# Patient Record
Sex: Male | Born: 1945 | Race: Black or African American | Hispanic: No | Marital: Married | Smoking: Former smoker
Health system: Southern US, Community
[De-identification: ages and names within clinical notes are randomized; demographics above are authoritative.]

## PROBLEM LIST (undated history)

## (undated) DIAGNOSIS — J449 Chronic obstructive pulmonary disease, unspecified: Secondary | ICD-10-CM

## (undated) DIAGNOSIS — I10 Essential (primary) hypertension: Secondary | ICD-10-CM

## (undated) DIAGNOSIS — M109 Gout, unspecified: Secondary | ICD-10-CM

## (undated) DIAGNOSIS — N189 Chronic kidney disease, unspecified: Secondary | ICD-10-CM

## (undated) DIAGNOSIS — Z992 Dependence on renal dialysis: Secondary | ICD-10-CM

## (undated) DIAGNOSIS — N186 End stage renal disease: Secondary | ICD-10-CM

## (undated) DIAGNOSIS — I639 Cerebral infarction, unspecified: Secondary | ICD-10-CM

## (undated) DIAGNOSIS — D631 Anemia in chronic kidney disease: Secondary | ICD-10-CM

## (undated) DIAGNOSIS — G40909 Epilepsy, unspecified, not intractable, without status epilepticus: Secondary | ICD-10-CM

## (undated) HISTORY — PX: OTHER SURGICAL HISTORY: SHX169

---

## 2014-01-17 ENCOUNTER — Emergency Department (HOSPITAL_COMMUNITY): Payer: Medicaid - Out of State

## 2014-01-17 ENCOUNTER — Inpatient Hospital Stay (HOSPITAL_COMMUNITY)
Admission: EM | Admit: 2014-01-17 | Discharge: 2014-01-19 | DRG: 189 | Disposition: A | Discharge status: Home / Self Care | Payer: Medicaid - Out of State | Attending: Internal Medicine | Admitting: Internal Medicine

## 2014-01-17 ENCOUNTER — Other Ambulatory Visit: Payer: Self-pay

## 2014-01-17 ENCOUNTER — Encounter (HOSPITAL_COMMUNITY): Payer: Self-pay | Admitting: Emergency Medicine

## 2014-01-17 DIAGNOSIS — J811 Chronic pulmonary edema: Principal | ICD-10-CM | POA: Diagnosis present

## 2014-01-17 DIAGNOSIS — J4489 Other specified chronic obstructive pulmonary disease: Secondary | ICD-10-CM | POA: Diagnosis present

## 2014-01-17 DIAGNOSIS — J810 Acute pulmonary edema: Secondary | ICD-10-CM

## 2014-01-17 DIAGNOSIS — J449 Chronic obstructive pulmonary disease, unspecified: Secondary | ICD-10-CM | POA: Diagnosis present

## 2014-01-17 DIAGNOSIS — J9600 Acute respiratory failure, unspecified whether with hypoxia or hypercapnia: Secondary | ICD-10-CM | POA: Diagnosis not present

## 2014-01-17 DIAGNOSIS — I120 Hypertensive chronic kidney disease with stage 5 chronic kidney disease or end stage renal disease: Secondary | ICD-10-CM | POA: Diagnosis present

## 2014-01-17 DIAGNOSIS — Z992 Dependence on renal dialysis: Secondary | ICD-10-CM | POA: Diagnosis not present

## 2014-01-17 DIAGNOSIS — R509 Fever, unspecified: Secondary | ICD-10-CM

## 2014-01-17 DIAGNOSIS — J9601 Acute respiratory failure with hypoxia: Secondary | ICD-10-CM

## 2014-01-17 DIAGNOSIS — R0602 Shortness of breath: Secondary | ICD-10-CM

## 2014-01-17 DIAGNOSIS — Z87891 Personal history of nicotine dependence: Secondary | ICD-10-CM | POA: Diagnosis not present

## 2014-01-17 DIAGNOSIS — D631 Anemia in chronic kidney disease: Secondary | ICD-10-CM

## 2014-01-17 DIAGNOSIS — G40909 Epilepsy, unspecified, not intractable, without status epilepticus: Secondary | ICD-10-CM

## 2014-01-17 DIAGNOSIS — I10 Essential (primary) hypertension: Secondary | ICD-10-CM | POA: Diagnosis present

## 2014-01-17 DIAGNOSIS — D696 Thrombocytopenia, unspecified: Secondary | ICD-10-CM | POA: Diagnosis present

## 2014-01-17 DIAGNOSIS — N186 End stage renal disease: Secondary | ICD-10-CM

## 2014-01-17 DIAGNOSIS — N039 Chronic nephritic syndrome with unspecified morphologic changes: Secondary | ICD-10-CM

## 2014-01-17 DIAGNOSIS — IMO0002 Reserved for concepts with insufficient information to code with codable children: Secondary | ICD-10-CM | POA: Diagnosis not present

## 2014-01-17 DIAGNOSIS — Z79899 Other long term (current) drug therapy: Secondary | ICD-10-CM | POA: Diagnosis not present

## 2014-01-17 DIAGNOSIS — N189 Chronic kidney disease, unspecified: Secondary | ICD-10-CM

## 2014-01-17 HISTORY — DX: Epilepsy, unspecified, not intractable, without status epilepticus: G40.909

## 2014-01-17 HISTORY — DX: Essential (primary) hypertension: I10

## 2014-01-17 LAB — BASIC METABOLIC PANEL
Anion gap: 16 — ABNORMAL HIGH (ref 5–15)
BUN: 60 mg/dL — AB (ref 6–23)
CO2: 26 meq/L (ref 19–32)
Calcium: 9.4 mg/dL (ref 8.4–10.5)
Chloride: 101 mEq/L (ref 96–112)
Creatinine, Ser: 10.92 mg/dL — ABNORMAL HIGH (ref 0.50–1.35)
GFR calc Af Amer: 5 mL/min — ABNORMAL LOW (ref >90)
GFR, EST NON AFRICAN AMERICAN: 4 mL/min — AB (ref >90)
GLUCOSE: 85 mg/dL (ref 70–99)
POTASSIUM: 5 meq/L (ref 3.7–5.3)
Sodium: 143 mEq/L (ref 137–147)

## 2014-01-17 LAB — TROPONIN I: Troponin I: <0.3 ng/mL (ref <0.30)

## 2014-01-17 LAB — CBC
HEMATOCRIT: 31.6 % — AB (ref 39.0–52.0)
HEMOGLOBIN: 10.6 g/dL — AB (ref 13.0–17.0)
MCH: 29.4 pg (ref 26.0–34.0)
MCHC: 33.5 g/dL (ref 30.0–36.0)
MCV: 87.5 fL (ref 78.0–100.0)
Platelets: 163 10*3/uL (ref 150–400)
RBC: 3.61 MIL/uL — ABNORMAL LOW (ref 4.22–5.81)
RDW: 16.8 % — ABNORMAL HIGH (ref 11.5–15.5)
WBC: 7.6 10*3/uL (ref 4.0–10.5)

## 2014-01-17 LAB — PRO B NATRIURETIC PEPTIDE: Pro B Natriuretic peptide (BNP): 54522 pg/mL — ABNORMAL HIGH (ref 0–125)

## 2014-01-17 LAB — PHENYTOIN LEVEL, TOTAL: Phenytoin Lvl: 4 ug/mL — ABNORMAL LOW (ref 10.0–20.0)

## 2014-01-17 LAB — PHOSPHORUS: Phosphorus: 3.8 mg/dL (ref 2.3–4.6)

## 2014-01-17 MED ORDER — NITROGLYCERIN 2 % TD OINT
1.0000 [in_us] | TOPICAL_OINTMENT | Freq: Once | TRANSDERMAL | Status: AC
  Administered 2014-01-17: 1 [in_us] via TOPICAL
  Filled 2014-01-17: qty 1

## 2014-01-17 NOTE — Consult Note (Signed)
Reason for Consult: CHF and end-stage renal disease Referring Physician: [facility]  Timothy Rodgers is an 68 y.o. male.  HPI: He is a patient was history of hypertension and end-stage renal disease presently came with complaints of difficulty breathing, orthopnea and paroxysmal nocturnal dyspnea. According to the patient he developed end-stage renal disease to uncontrolled hypertension. He received cadaver kidney transplant which lasted for 13 years and they lost about a year ago and put her back on hemodialysis. Presently lives in [place] but came here about a week ago. He received dialysis Thursday and Saturday. Patient is due for dialysis tomorrow.  Past Medical History  Diagnosis Date  . Kidney disease, chronic, end stage on dialysis     sat, tues, thurs  . Hypertension     History reviewed. No pertinent past surgical history.  History reviewed. No pertinent family history.  Social History:  reports that he has quit smoking. He does not have any smokeless tobacco history on file. He reports that he does not drink alcohol or use illicit drugs.  Allergies: No Known Allergies  Medications: I have reviewed the patient's current medications.  Results for orders placed during the hospital encounter of 01/17/14 (from the past 48 hour(s))  CBC     Status: Abnormal   Collection Time    01/17/14  9:15 PM      Result Value Ref Range   WBC 7.6  4.0 - 10.5 K/uL   RBC 3.61 (*) 4.22 - 5.81 MIL/uL   Hemoglobin 10.6 (*) 13.0 - 17.0 g/dL   HCT 31.6 (*) 39.0 - 52.0 %   MCV 87.5  78.0 - 100.0 fL   MCH 29.4  26.0 - 34.0 pg   MCHC 33.5  30.0 - 36.0 g/dL   RDW 16.8 (*) 11.5 - 15.5 %   Platelets 163  150 - 400 K/uL  BASIC METABOLIC PANEL     Status: Abnormal   Collection Time    01/17/14  9:15 PM      Result Value Ref Range   Sodium 143  137 - 147 mEq/L   Potassium 5.0  3.7 - 5.3 mEq/L   Chloride 101  96 - 112 mEq/L   CO2 26  19 - 32 mEq/L   Glucose, Bld 85  70 - 99 mg/dL    BUN 60 (*) 6 - 23 mg/dL   Creatinine, Ser 10.92 (*) 0.50 - 1.35 mg/dL   Calcium 9.4  8.4 - 10.5 mg/dL   GFR calc non Af Amer 4 (*) >90 mL/min   GFR calc Af Amer 5 (*) >90 mL/min   Comment: (NOTE)     The eGFR has been calculated using the CKD EPI equation.     This calculation has not been validated in all clinical situations.     eGFR's persistently <90 mL/min signify possible Chronic Kidney     Disease.   Anion gap 16 (*) 5 - 15  PRO B NATRIURETIC PEPTIDE     Status: Abnormal   Collection Time    01/17/14  9:15 PM      Result Value Ref Range   Pro B Natriuretic peptide (BNP) 54522.0 (*) 0 - 125 pg/mL  TROPONIN I     Status: None   Collection Time    01/17/14  9:15 PM      Result Value Ref Range   Troponin I <0.30  <0.30 ng/mL   Comment:  Due to the release kinetics of cTnI,     a negative result within the first hours     of the onset of symptoms does not rule out     myocardial infarction with certainty.     If myocardial infarction is still suspected,     repeat the test at appropriate intervals.  PHENYTOIN LEVEL, TOTAL     Status: Abnormal   Collection Time    01/17/14  9:24 PM      Result Value Ref Range   Phenytoin Lvl 4.0 (*) 10.0 - 20.0 ug/mL    Dg Chest Port 1 View  01/17/2014   CLINICAL DATA:  Shortness of breath.  EXAM: PORTABLE CHEST - 1 VIEW  COMPARISON:  None.  FINDINGS: There is marked enlargement of the cardiopericardial silhouette with pulmonary edema. No pneumothorax or pleural effusion. Surgical clips right axilla noted.  IMPRESSION: Congestive heart failure.   Electronically Signed   By: Inge Rise M.D.   On: 01/17/2014 21:51    Review of Systems  Constitutional: Negative for fever.  HENT: Positive for congestion.   Respiratory: Positive for shortness of breath and wheezing.   Cardiovascular: Positive for orthopnea and PND.       Patient has a graft on his right chest coming to the midline.  Gastrointestinal: Negative for nausea and  vomiting.  Neurological: Positive for weakness.   Blood pressure 165/98, pulse 93, temperature 99 F (37.2 C), temperature source Oral, resp. rate 32, height _0  (1.702 m), weight 58.968 kg (130 lb), SpO2 94.00%. Physical Exam  Constitutional: He is oriented to person, place, and time. No distress.  Eyes: No scleral icterus.  Neck: JVD present.  Cardiovascular: Normal rate.   No murmur heard. Respiratory: He is in respiratory distress. He has wheezes. He has rales.  GI: He exhibits no distension. There is no tenderness.  Musculoskeletal: He exhibits no edema.  Neurological: He is alert and oriented to person, place, and time.    Assessment/Plan: Problem #1 CHF: Is due to uncontrolled salt and fluid intake. Presently with significant CHF and hypoxemia. Problem #2 end-stage renal disease: Presently patient denies any nausea no vomiting. Problem #3 anemia his hemoglobin and hematocrit is in target range Problem #4 hypertension: His blood pressure seems to be reasonably controlled Problem #5 metabolic bone disease: His calcium is range. Problem #6 history of gout: Patient on allopurinol but no recent activation Problem #7 history of seizure disorder Problem #8 history of COPD Plan: We'll make arrangements for patient to get dialysis today for 3 hours We'll try to move about 3 L if his blood pressure tolerates. We'll check his basic metabolic panel, CBC and phosphorus in the morning.  Timothy Rodgers S 01/17/2014, 10:43 PM

## 2014-01-17 NOTE — ED Provider Notes (Addendum)
CSN: [id number]     Arrival date & time 01/17/14  2047 History   First MD Initiated Contact with Patient 01/17/14 2128     Chief Complaint  Patient presents with  . Shortness of Breath     (Consider location/radiation/quality/duration/timing/severity/associated sxs/prior Treatment) HPI Comments: Acute onset of SOB x 1hour.  Denies chest pain.  HX ESRD on dialysis, last treatment was Saturday.  Visiting from [place] but has been getting dialysis as scheduled TTS and not missed any sessions.  Denies chest pain.  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  No fever, cough, abdominal pain, nausea, vomiting.  Makes very little urine.  The history is provided by the patient. The history is limited by the condition of the patient.    Past Medical History  Diagnosis Date  . Kidney disease, chronic, end stage on dialysis     sat, tues, thurs  . Hypertension    History reviewed. No pertinent past surgical history. History reviewed. No pertinent family history. History  Substance Use Topics  . Smoking status: Former Games developer  . Smokeless tobacco: Not on file  . Alcohol Use: No    Review of Systems  Unable to perform ROS: Severe respiratory distress      Allergies  Review of patient's allergies indicates no known allergies.  Home Medications   Prior to Admission medications   Medication Sig Start Date End Date Taking? Authorizing Provider  acetaminophen (TYLENOL) 325 MG tablet Take 650 mg by mouth every 6 (six) hours as needed.   Yes Historical Provider, MD  allopurinol (ZYLOPRIM) 100 MG tablet Take 100 mg by mouth daily.   Yes Historical Provider, MD  b complex-vitamin c-folic acid (NEPHRO-VITE) 0.8 MG TABS tablet Take 1 tablet by mouth daily.   Yes Historical Provider, MD  carvedilol (COREG) 6.25 MG tablet Take 6.25 mg by mouth 2 (two) times daily.   Yes Historical Provider, MD  ipratropium (ATROVENT HFA) 17 MCG/ACT inhaler Inhale 1 puff into the lungs every 6  (six) hours.   Yes Historical Provider, MD  lisinopril (PRINIVIL,ZESTRIL) 20 MG tablet Take 20 mg by mouth daily.   Yes Historical Provider, MD  NIFEdipine (PROCARDIA XL/ADALAT-CC) 90 MG 24 hr tablet Take 90 mg by mouth daily.   Yes Historical Provider, MD  ondansetron (ZOFRAN) 4 MG tablet Take 4 mg by mouth 3 (three) times daily.   Yes Historical Provider, MD  phenytoin (DILANTIN) 100 MG ER capsule Take 200-300 mg by mouth 2 (two) times daily. Two in the morning and three at bedtime   Yes Historical Provider, MD  predniSONE (DELTASONE) 5 MG tablet Take 5 mg by mouth daily with breakfast.   Yes Historical Provider, MD  sevelamer carbonate (RENVELA) 800 MG tablet Take 800-1,600 mg by mouth 3 (three) times daily with meals. 2 with meals and one with snacks   Yes Historical Provider, MD   BP 162/96  Pulse 102  Temp(Src) 98.5 F (36.9 C) (Oral)  Resp 32  Ht 5\' 8"  (1.727 m)  Wt 139 lb 12.4 oz (63.4 kg)  BMI 21.26 kg/m2  SpO2 98% Physical Exam  Nursing note and vitals reviewed. Constitutional: He is oriented to person, place, and time. He appears well-developed and well-nourished. He appears distressed.  Increased work of breathing, speaking in short phrases  HENT:  Head: Normocephalic and atraumatic.  Mouth/Throat: Oropharynx is clear and moist. No oropharyngeal exudate.  Eyes: Conjunctivae and EOM are normal. Pupils are equal, round, and reactive  to light.  Neck: Normal range of motion. Neck supple.  No meningismus.  Cardiovascular: Normal rate, regular rhythm, normal heart sounds and intact distal pulses.   No murmur heard. Pulmonary/Chest: He is in respiratory distress. He has rales.  Dialysis catheter R chest.  Rales diffusely  Abdominal: Soft. There is no tenderness. There is no rebound and no guarding.  Musculoskeletal: Normal range of motion. He exhibits no edema and no tenderness.  Neurological: He is alert and oriented to person, place, and time. No cranial nerve deficit. He  exhibits normal muscle tone. Coordination normal.  L sided weakness at baseline per patient.  Full strength on R side.  Skin: Skin is warm.  Psychiatric: He has a normal mood and affect. His behavior is normal.    ED Course  Procedures (including critical care time) Labs Review Labs Reviewed  CBC - Abnormal; Notable for the following:    RBC 3.61 (*)    Hemoglobin 10.6 (*)    HCT 31.6 (*)    RDW 16.8 (*)    All other components within normal limits  BASIC METABOLIC PANEL - Abnormal; Notable for the following:    BUN 60 (*)    Creatinine, Ser 10.92 (*)    GFR calc non Af Amer 4 (*)    GFR calc Af Amer 5 (*)    Anion gap 16 (*)    All other components within normal limits  PRO B NATRIURETIC PEPTIDE - Abnormal; Notable for the following:    Pro B Natriuretic peptide (BNP) 54522.0 (*)    All other components within normal limits  PHENYTOIN LEVEL, TOTAL - Abnormal; Notable for the following:    Phenytoin Lvl 4.0 (*)    All other components within normal limits  MRSA PCR SCREENING  TROPONIN I  PHOSPHORUS  CBC  BASIC METABOLIC PANEL  HEPATITIS B SURFACE ANTIGEN    Imaging Review Dg Chest Port 1 View  01/17/2014   CLINICAL DATA:  Shortness of breath.  EXAM: PORTABLE CHEST - 1 VIEW  COMPARISON:  None.  FINDINGS: There is marked enlargement of the cardiopericardial silhouette with pulmonary edema. No pneumothorax or pleural effusion. Surgical clips right axilla noted.  IMPRESSION: Congestive heart failure.   Electronically Signed   By: Drusilla Kanner M.D.   On: 01/17/2014 21:51     EKG Interpretation None      MDM   Final diagnoses:  Acute respiratory failure with hypoxia  Acute pulmonary edema   Shortness of breath with pulmonary edema, ESRD.  No chest pain .Marland Kitchen EKG with sinus tach. CXR with pulmonary edema.  Patient placed on bipap.  Nitro paste applied.  Lasix not given as anuric. Hypoxic on room air with respiratory distress. K is 5. Needs emergent dialysis for  volume overload.  D/w Dr. Fausto Skillern who will arrange.  Dr. Onalee Hua will admit.    Date: 01/18/2014  Rate:103  Rhythm:sinus tachycardia  QRS Axis: normal  Intervals: normal  ST/T Wave abnormalities:nonspecific ST changes  Conduction Disutrbances: none  Narrative Interpretation:   Old EKG Reviewed: none available.  CRITICAL CARE Performed by: Glynn Octave Total critical care time: 30 Critical care time was exclusive of separately billable procedures and treating other patients. Critical care was necessary to treat or prevent imminent or life-threatening deterioration. Critical care was time spent personally by me on the following activities: development of treatment plan with patient and/or surrogate as well as nursing, discussions with consultants, evaluation of patient's response to treatment, examination of patient, obtaining  history from patient or surrogate, ordering and performing treatments and interventions, ordering and review of laboratory studies, ordering and review of radiographic studies, pulse oximetry and re-evaluation of patient's condition.    Glynn OctaveStephen Dreshaun Stene, MD 01/18/14 [id number]  Glynn OctaveStephen Everrett Lacasse, MD 01/18/14 1149

## 2014-01-17 NOTE — ED Notes (Signed)
Pt c/o sob and is due for dialysis tomorrow.

## 2014-01-17 NOTE — ED Notes (Signed)
Pt reporting increasing SOB today.  Reports last dialysis treatment was on Saturday.  States that due to hot weather, he knows he has exceeded his fluid restriction.

## 2014-01-17 NOTE — H&P (Signed)
Chief Complaint:  sob  HPI: 68 yo male esrd dialysis on t/r/sat came to [place] from [place] on a 3 day bus ride over a week ago.  He arrived here on Thursday before last, had left [place] on Monday.  He missed the Tuesday session.  On Thursday July 17 his son went to pick him up at bus station and went to dialysis which was set up prior to him leaving [place] for vacation.  His access was clotted off, so he was sent to [place] and the clot was removed (i see no records of this encouter in epic).  Had dialysis on Friday, Saturday, then again on the following tues/thurs/saturday.  He says during his 3 day bus ride, he minimized his fluid intake b/c he knew that he would be missing one dialysis session to get here to visit his 2 sons.  He seemed to be doing well but has had some le swelling/edema.  He reports that they are not using the same dry weight here as his dialysis unit in [place] uses.  There they use 58 kg, and here they have been using something in the mid 60s.  Today he got very sob.  No chest pain.  No pain either with deep inspiration.  No leg pain.  No fevers, no cough.  No n/v/d.  He was placed on bipap for several hours in the ED and he is feeling much better and less sob.  He is getting dialysis started right now emergently in stepdown.  Review of Systems:  Positive and negative as per HPI otherwise all other systems are negative  Past Medical History: Past Medical History  Diagnosis Date  . Kidney disease, chronic, end stage on dialysis     sat, tues, thurs  . Hypertension    History reviewed. No pertinent past surgical history.  Medications: Prior to Admission medications   Medication Sig Start Date End Date Taking? Authorizing Provider  acetaminophen (TYLENOL) 325 MG tablet Take 650 mg by mouth every 6 (six) hours as needed.   Yes Historical Provider, MD  allopurinol (ZYLOPRIM) 100 MG tablet Take 100 mg by mouth daily.   Yes Historical Provider, MD  b  complex-vitamin c-folic acid (NEPHRO-VITE) 0.8 MG TABS tablet Take 1 tablet by mouth daily.   Yes Historical Provider, MD  carvedilol (COREG) 6.25 MG tablet Take 6.25 mg by mouth 2 (two) times daily.   Yes Historical Provider, MD  ipratropium (ATROVENT HFA) 17 MCG/ACT inhaler Inhale 1 puff into the lungs every 6 (six) hours.   Yes Historical Provider, MD  lisinopril (PRINIVIL,ZESTRIL) 20 MG tablet Take 20 mg by mouth daily.   Yes Historical Provider, MD  NIFEdipine (PROCARDIA XL/ADALAT-CC) 90 MG 24 hr tablet Take 90 mg by mouth daily.   Yes Historical Provider, MD  ondansetron (ZOFRAN) 4 MG tablet Take 4 mg by mouth 3 (three) times daily.   Yes Historical Provider, MD  phenytoin (DILANTIN) 100 MG ER capsule Take 200-300 mg by mouth 2 (two) times daily. Two in the morning and three at bedtime   Yes Historical Provider, MD  predniSONE (DELTASONE) 5 MG tablet Take 5 mg by mouth daily with breakfast.   Yes Historical Provider, MD  sevelamer carbonate (RENVELA) 800 MG tablet Take 800-1,600 mg by mouth 3 (three) times daily with meals. 2 with meals and one with snacks   Yes Historical Provider, MD    Allergies:  No Known Allergies  Social History:  reports that he has quit smoking.  He does not have any smokeless tobacco history on file. He reports that he does not drink alcohol or use illicit drugs.  Family History: History reviewed. No pertinent family history.  Physical Exam: Filed Vitals:   01/17/14 2130 01/17/14 2147 01/17/14 2200 01/17/14 2230  BP: 165/100 165/100 159/93 165/98  Pulse: 106 101 100 93  Temp:      TempSrc:      Resp:  32    Height:      Weight:      SpO2: 95% 93% 94% 94%   General appearance: alert, cooperative and no distress Head: Normocephalic, without obvious abnormality, atraumatic Eyes: negative Nose: Nares normal. Septum midline. Mucosa normal. No drainage or sinus tenderness. Neck: no JVD and supple, symmetrical, trachea midline Lungs: rales  bibasilar Heart: regular rate and rhythm, S1, S2 normal, no murmur, click, rub or gallop Abdomen: soft, non-tender; bowel sounds normal; no masses,  no organomegaly Extremities: extremities normal, atraumatic, no cyanosis or edema and edema 2+ Pulses: 2+ and symmetric Skin: Skin color, texture, turgor normal. No rashes or lesions Neurologic: Grossly normal    Labs on Admission:   Recent Labs  01/17/14 2115 01/17/14 2242  NA 143  --   K 5.0  --   CL 101  --   CO2 26  --   GLUCOSE 85  --   BUN 60*  --   CREATININE 10.92*  --   CALCIUM 9.4  --   PHOS  --  3.8    Recent Labs  01/17/14 2115  WBC 7.6  HGB 10.6*  HCT 31.6*  MCV 87.5  PLT 163    Recent Labs  01/17/14 2115  TROPONINI <0.30   Radiological Exams on Admission: Dg Chest Port 1 View  01/17/2014   CLINICAL DATA:  Shortness of breath.  EXAM: PORTABLE CHEST - 1 VIEW  COMPARISON:  None.  FINDINGS: There is marked enlargement of the cardiopericardial silhouette with pulmonary edema. No pneumothorax or pleural effusion. Surgical clips right axilla noted.  IMPRESSION: Congestive heart failure.   Electronically Signed   By: Drusilla Kannerhomas  Dalessio M.D.   On: 01/17/2014 21:51    Assessment/Plan  68 yo male dialysis patient with recent long distance traveling with acute respiratory failure due to pulmonary edema/fluid overload  Principal Problem:   Pulmonary edema-  Getting dialysis now.  Will also check vq scan in am to r/o PE.  bipap prn.  Edema probably due to inadequate dialysis over the last week since [facility].  He will be here for a little over a month for vacation.  nephro consulted also.  Active Problems:   Kidney disease, chronic, end stage on dialysis   Acute respiratory failure with hypoxia   SOB (shortness of breath)    DAVID,RACHAL A 01/17/2014, 11:07 PM

## 2014-01-18 ENCOUNTER — Inpatient Hospital Stay (HOSPITAL_COMMUNITY): Payer: Medicaid - Out of State

## 2014-01-18 ENCOUNTER — Encounter (HOSPITAL_COMMUNITY): Payer: Self-pay | Admitting: *Deleted

## 2014-01-18 DIAGNOSIS — N189 Chronic kidney disease, unspecified: Secondary | ICD-10-CM

## 2014-01-18 DIAGNOSIS — G40909 Epilepsy, unspecified, not intractable, without status epilepticus: Secondary | ICD-10-CM | POA: Diagnosis present

## 2014-01-18 DIAGNOSIS — R509 Fever, unspecified: Secondary | ICD-10-CM | POA: Diagnosis not present

## 2014-01-18 DIAGNOSIS — I10 Essential (primary) hypertension: Secondary | ICD-10-CM | POA: Diagnosis present

## 2014-01-18 DIAGNOSIS — D696 Thrombocytopenia, unspecified: Secondary | ICD-10-CM | POA: Diagnosis present

## 2014-01-18 DIAGNOSIS — D631 Anemia in chronic kidney disease: Secondary | ICD-10-CM

## 2014-01-18 HISTORY — DX: Anemia in chronic kidney disease: N18.9

## 2014-01-18 HISTORY — DX: Anemia in chronic kidney disease: D63.1

## 2014-01-18 LAB — TSH: TSH: 2.1 u[IU]/mL (ref 0.350–4.500)

## 2014-01-18 LAB — BASIC METABOLIC PANEL
Anion gap: 14 (ref 5–15)
BUN: 35 mg/dL — AB (ref 6–23)
CALCIUM: 9.2 mg/dL (ref 8.4–10.5)
CO2: 28 mEq/L (ref 19–32)
Chloride: 97 mEq/L (ref 96–112)
Creatinine, Ser: 7.14 mg/dL — ABNORMAL HIGH (ref 0.50–1.35)
GFR calc Af Amer: 8 mL/min — ABNORMAL LOW (ref >90)
GFR calc non Af Amer: 7 mL/min — ABNORMAL LOW (ref >90)
GLUCOSE: 120 mg/dL — AB (ref 70–99)
Potassium: 4 mEq/L (ref 3.7–5.3)
SODIUM: 139 meq/L (ref 137–147)

## 2014-01-18 LAB — HEPATITIS B SURFACE ANTIGEN: Hepatitis B Surface Ag: NEGATIVE

## 2014-01-18 LAB — CBC
HEMATOCRIT: 33.5 % — AB (ref 39.0–52.0)
HEMOGLOBIN: 11.2 g/dL — AB (ref 13.0–17.0)
MCH: 29.5 pg (ref 26.0–34.0)
MCHC: 33.4 g/dL (ref 30.0–36.0)
MCV: 88.2 fL (ref 78.0–100.0)
Platelets: 127 10*3/uL — ABNORMAL LOW (ref 150–400)
RBC: 3.8 MIL/uL — AB (ref 4.22–5.81)
RDW: 17.1 % — ABNORMAL HIGH (ref 11.5–15.5)
WBC: 12.4 10*3/uL — ABNORMAL HIGH (ref 4.0–10.5)

## 2014-01-18 LAB — MRSA PCR SCREENING: MRSA by PCR: NEGATIVE

## 2014-01-18 LAB — IRON AND TIBC
Iron: 10 ug/dL — ABNORMAL LOW (ref 42–135)
Saturation Ratios: 7 % — ABNORMAL LOW (ref 20–55)
TIBC: 148 ug/dL — ABNORMAL LOW (ref 215–435)
UIBC: 138 ug/dL (ref 125–400)

## 2014-01-18 LAB — VITAMIN B12: Vitamin B-12: 856 pg/mL (ref 211–911)

## 2014-01-18 MED ORDER — EPOETIN ALFA 10000 UNIT/ML IJ SOLN: 10000.0000 [IU] | INTRAMUSCULAR | Status: DC

## 2014-01-18 MED ORDER — SODIUM CHLORIDE 0.9 % IV SOLN: 100.0000 mL | INTRAVENOUS | Status: DC | PRN

## 2014-01-18 MED ORDER — SEVELAMER CARBONATE 800 MG PO TABS
1600.0000 mg | ORAL_TABLET | Freq: Three times a day (TID) | ORAL | Status: DC
  Administered 2014-01-18 – 2014-01-19 (×5): 1600 mg via ORAL
  Filled 2014-01-18 (×5): qty 2

## 2014-01-18 MED ORDER — LIDOCAINE-PRILOCAINE 2.5-2.5 % EX CREA
1.0000 "application " | TOPICAL_CREAM | CUTANEOUS | Status: DC | PRN
  Filled 2014-01-18: qty 5

## 2014-01-18 MED ORDER — SODIUM CHLORIDE 0.9 % IJ SOLN
INTRAMUSCULAR | Status: AC
  Filled 2014-01-18: qty 36

## 2014-01-18 MED ORDER — BIOTENE DRY MOUTH MT LIQD
15.0000 mL | Freq: Two times a day (BID) | OROMUCOSAL | Status: DC
  Administered 2014-01-18 – 2014-01-19 (×4): 15 mL via OROMUCOSAL

## 2014-01-18 MED ORDER — ONDANSETRON HCL 4 MG/2ML IJ SOLN: 4.0000 mg | Freq: Four times a day (QID) | INTRAMUSCULAR | Status: DC | PRN

## 2014-01-18 MED ORDER — PHENYTOIN SODIUM EXTENDED 100 MG PO CAPS
300.0000 mg | ORAL_CAPSULE | Freq: Every day | ORAL | Status: DC
  Administered 2014-01-18 (×2): 300 mg via ORAL
  Filled 2014-01-18 (×2): qty 3

## 2014-01-18 MED ORDER — TECHNETIUM TO 99M ALBUMIN AGGREGATED
6.0000 | Freq: Once | INTRAVENOUS | Status: AC | PRN
  Administered 2014-01-18: 6 via INTRAVENOUS

## 2014-01-18 MED ORDER — IPRATROPIUM BROMIDE 0.02 % IN SOLN
0.5000 mg | Freq: Four times a day (QID) | RESPIRATORY_TRACT | Status: DC
  Administered 2014-01-18: 0.5 mg via RESPIRATORY_TRACT
  Filled 2014-01-18: qty 2.5

## 2014-01-18 MED ORDER — LISINOPRIL 10 MG PO TABS
20.0000 mg | ORAL_TABLET | Freq: Every day | ORAL | Status: DC
  Administered 2014-01-18 – 2014-01-19 (×2): 20 mg via ORAL
  Filled 2014-01-18 (×2): qty 2

## 2014-01-18 MED ORDER — NEPRO/CARBSTEADY PO LIQD: 237.0000 mL | ORAL | Status: DC | PRN

## 2014-01-18 MED ORDER — PENTAFLUOROPROP-TETRAFLUOROETH EX AERO
1.0000 "application " | INHALATION_SPRAY | CUTANEOUS | Status: DC | PRN
  Filled 2014-01-18: qty 103.5

## 2014-01-18 MED ORDER — ALTEPLASE 2 MG IJ SOLR: 2.0000 mg | Freq: Once | INTRAMUSCULAR | Status: AC | PRN

## 2014-01-18 MED ORDER — LIDOCAINE HCL (PF) 1 % IJ SOLN: 5.0000 mL | INTRAMUSCULAR | Status: DC | PRN

## 2014-01-18 MED ORDER — HEPARIN SODIUM (PORCINE) 1000 UNIT/ML DIALYSIS
1000.0000 [IU] | INTRAMUSCULAR | Status: DC | PRN
  Filled 2014-01-18: qty 1

## 2014-01-18 MED ORDER — ALLOPURINOL 100 MG PO TABS
100.0000 mg | ORAL_TABLET | Freq: Every day | ORAL | Status: DC
  Administered 2014-01-18 – 2014-01-19 (×2): 100 mg via ORAL
  Filled 2014-01-18 (×2): qty 1

## 2014-01-18 MED ORDER — SODIUM CHLORIDE 0.9 % IV SOLN: 250.0000 mL | INTRAVENOUS | Status: DC | PRN

## 2014-01-18 MED ORDER — VANCOMYCIN HCL 500 MG IV SOLR
500.0000 mg | INTRAVENOUS | Status: DC
  Administered 2014-01-19: 500 mg via INTRAVENOUS
  Filled 2014-01-18: qty 500

## 2014-01-18 MED ORDER — NIFEDIPINE ER OSMOTIC RELEASE 30 MG PO TB24
90.0000 mg | ORAL_TABLET | Freq: Every day | ORAL | Status: DC
  Administered 2014-01-18 – 2014-01-19 (×2): 90 mg via ORAL
  Filled 2014-01-18 (×2): qty 3

## 2014-01-18 MED ORDER — SODIUM CHLORIDE 0.9 % IJ SOLN
3.0000 mL | Freq: Two times a day (BID) | INTRAMUSCULAR | Status: DC
  Administered 2014-01-18 – 2014-01-19 (×4): 3 mL via INTRAVENOUS

## 2014-01-18 MED ORDER — IPRATROPIUM BROMIDE HFA 17 MCG/ACT IN AERS: 1.0000 | INHALATION_SPRAY | Freq: Four times a day (QID) | RESPIRATORY_TRACT | Status: DC

## 2014-01-18 MED ORDER — SODIUM CHLORIDE 0.9 % IJ SOLN: 3.0000 mL | INTRAMUSCULAR | Status: DC | PRN

## 2014-01-18 MED ORDER — PREDNISONE 10 MG PO TABS
5.0000 mg | ORAL_TABLET | Freq: Every day | ORAL | Status: DC
  Administered 2014-01-18 – 2014-01-19 (×2): 5 mg via ORAL
  Filled 2014-01-18 (×2): qty 1

## 2014-01-18 MED ORDER — ONDANSETRON HCL 4 MG PO TABS
4.0000 mg | ORAL_TABLET | Freq: Three times a day (TID) | ORAL | Status: DC
  Administered 2014-01-18 – 2014-01-19 (×5): 4 mg via ORAL
  Filled 2014-01-18 (×5): qty 1

## 2014-01-18 MED ORDER — CARVEDILOL 3.125 MG PO TABS
6.2500 mg | ORAL_TABLET | Freq: Two times a day (BID) | ORAL | Status: DC
  Administered 2014-01-18 – 2014-01-19 (×3): 6.25 mg via ORAL
  Filled 2014-01-18 (×3): qty 2

## 2014-01-18 MED ORDER — PHENYTOIN SODIUM EXTENDED 100 MG PO CAPS
200.0000 mg | ORAL_CAPSULE | Freq: Every day | ORAL | Status: DC
  Administered 2014-01-18 – 2014-01-19 (×2): 200 mg via ORAL
  Filled 2014-01-18 (×2): qty 2

## 2014-01-18 MED ORDER — ONDANSETRON HCL 4 MG PO TABS: 4.0000 mg | ORAL_TABLET | Freq: Four times a day (QID) | ORAL | Status: DC | PRN

## 2014-01-18 MED ORDER — TECHNETIUM TC 99M DIETHYLENETRIAME-PENTAACETIC ACID
40.0000 | Freq: Once | INTRAVENOUS | Status: AC | PRN
  Administered 2014-01-18: 40 via RESPIRATORY_TRACT

## 2014-01-18 MED ORDER — VANCOMYCIN HCL 10 G IV SOLR
1250.0000 mg | Freq: Once | INTRAVENOUS | Status: AC
  Administered 2014-01-18: 1250 mg via INTRAVENOUS
  Filled 2014-01-18: qty 1250

## 2014-01-18 MED ORDER — IPRATROPIUM-ALBUTEROL 0.5-2.5 (3) MG/3ML IN SOLN
3.0000 mL | Freq: Two times a day (BID) | RESPIRATORY_TRACT | Status: DC
  Administered 2014-01-18 – 2014-01-19 (×2): 3 mL via RESPIRATORY_TRACT
  Filled 2014-01-18 (×2): qty 3

## 2014-01-18 MED ORDER — SEVELAMER CARBONATE 800 MG PO TABS: 800.0000 mg | ORAL_TABLET | ORAL | Status: DC | PRN

## 2014-01-18 NOTE — Procedures (Signed)
   HEMODIALYSIS TREATMENT NOTE:  3 hour heparin-free emergent HD completed via right chest wall AVG (15g/antegrade).  Goal met:  Tolerated removal of 3.2 liters.  Ultrafiltration was interrupted x15 minutes at pt's request d/t nausea; BP stable.  Weaned from BiPAP to 1L O2 via [place] with spO2 97-100%.  All blood was reinfused and hemostasis was achieved within 15 minutes.  Report given to [facility], RN.  Ailey Wessling L. Kennede Lusk, RN, CDN

## 2014-01-18 NOTE — Progress Notes (Signed)
ANTIBIOTIC CONSULT NOTE - INITIAL  Pharmacy Consult for Vancomycin Indication: empiric Rx for fever, ESRD / dialysis  No Known Allergies  Patient Measurements: Height: 5\' 8"  (172.7 cm) Weight: 133 lb 9.6 oz (60.6 kg) IBW/kg (Calculated) : 68.4  Vital Signs: Temp: 98.8 F (37.1 C) (07/28 1139) Temp src: Oral (07/28 1139) BP: 129/80 mmHg (07/28 0700) Pulse Rate: 103 (07/28 0400) Intake/Output from previous day: 07/27 0701 - 07/28 0700 In: -  Out: 3220  Intake/Output from this shift: Total I/O In: 240 [P.O.:240] Out: -   Labs:  Recent Labs  01/17/14 2115 01/18/14 0432  WBC 7.6 12.4*  HGB 10.6* 11.2*  PLT 163 127*  CREATININE 10.92* 7.14*   Estimated Creatinine Clearance: 8.7 ml/min (by C-G formula based on Cr of 7.14). No results found for this basename: VANCOTROUGH, Leodis Binet, VANCORANDOM, GENTTROUGH, GENTPEAK, GENTRANDOM, TOBRATROUGH, TOBRAPEAK, TOBRARND, AMIKACINPEAK, AMIKACINTROU, AMIKACIN,  in the last 72 hours   Microbiology: Recent Results (from the past 720 hour(s))  MRSA PCR SCREENING     Status: None   Collection Time    01/17/14 11:40 PM      Result Value Ref Range Status   MRSA by PCR NEGATIVE  NEGATIVE Final   Comment:            The GeneXpert MRSA Assay (FDA     approved for NASAL specimens     only), is one component of a     comprehensive MRSA colonization     surveillance program. It is not     intended to diagnose MRSA     infection nor to guide or     monitor treatment for     MRSA infections.  CULTURE, BLOOD (ROUTINE X 2)     Status: None   Collection Time    01/18/14  7:26 AM      Result Value Ref Range Status   Specimen Description BLOOD RIGHT HAND   Final   Special Requests BOTTLES DRAWN AEROBIC AND ANAEROBIC 7CC   Final   Culture NO GROWTH <24 HRS   Final   Report Status PENDING   Incomplete  CULTURE, BLOOD (ROUTINE X 2)     Status: None   Collection Time    01/18/14  7:26 AM      Result Value Ref Range Status   Specimen  Description BLOOD RIGHT ARM   Final   Special Requests BOTTLES DRAWN AEROBIC AND ANAEROBIC 6CC   Final   Culture NO GROWTH <24 HRS   Final   Report Status PENDING   Incomplete   Medical History: Past Medical History  Diagnosis Date  . Kidney disease, chronic, end stage on dialysis     sat, tues, thurs  . Hypertension   . Seizure disorder    Medications:  Prescriptions prior to admission  Medication Sig Dispense Refill  . acetaminophen (TYLENOL) 325 MG tablet Take 650 mg by mouth every 6 (six) hours as needed.      Marland Kitchen allopurinol (ZYLOPRIM) 100 MG tablet Take 100 mg by mouth daily.      Marland Kitchen b complex-vitamin c-folic acid (NEPHRO-VITE) 0.8 MG TABS tablet Take 1 tablet by mouth daily.      . carvedilol (COREG) 6.25 MG tablet Take 6.25 mg by mouth 2 (two) times daily.      Marland Kitchen ipratropium (ATROVENT HFA) 17 MCG/ACT inhaler Inhale 1 puff into the lungs every 6 (six) hours.      Marland Kitchen lisinopril (PRINIVIL,ZESTRIL) 20 MG tablet Take 20 mg by  mouth daily.      Marland Kitchen. NIFEdipine (PROCARDIA XL/ADALAT-CC) 90 MG 24 hr tablet Take 90 mg by mouth daily.      . ondansetron (ZOFRAN) 4 MG tablet Take 4 mg by mouth 3 (three) times daily.      . phenytoin (DILANTIN) 100 MG ER capsule Take 200-300 mg by mouth 2 (two) times daily. Two in the morning and three at bedtime      . predniSONE (DELTASONE) 5 MG tablet Take 5 mg by mouth daily with breakfast.      . sevelamer carbonate (RENVELA) 800 MG tablet Take 800-1,600 mg by mouth 3 (three) times daily with meals. 2 with meals and one with snacks       Assessment: 68yo [place] from [place].  Pt has ESRD requiring dialysis and pt missed session while traveling via bus to [place].  Pt admitted with SOB and excess fluid.  Noted to have low grade fever and Vancomycin started empirically.  Blood cultures have been drawn.    Goal of Therapy:  Pre-Hemodialysis Vancomycin level goal range =15-25 mcg/ml  Plan:  Vancomycin 1250mg  IV today x 1 Vancomycin 500mg  IV  after each HD Check pre-dialysis level at steady state Monitor labs, progress, plan, and cultures  Valrie HartHall, Loryn Haacke A 01/18/2014,12:02 PM

## 2014-01-18 NOTE — Progress Notes (Signed)
Subjective: Interval History: has no complaint of nausea or vomiting. Patient feels much better today as far as his breathing is concerned. Patient denies any cough..  Objective: Vital signs in last 24 hours: Temp:  [98.5 F (36.9 C)-100.7 F (38.2 C)] 100.7 F (38.2 C) (07/28 0400) Pulse Rate:  [67-107] 103 (07/28 0400) Resp:  [21-45] 24 (07/28 0600) BP: (129-173)/(72-105) 139/81 mmHg (07/28 0600) SpO2:  [87 %-100 %] 98 % (07/28 0709) Weight:  [58.968 kg (130 lb)-63.4 kg (139 lb 12.4 oz)] 60.6 kg (133 lb 9.6 oz) (07/28 0320) Weight change:   Intake/Output from previous day: 07/27 0701 - 07/28 0700 In: -  Out: 3220  Intake/Output this shift:    General appearance: alert, cooperative and no distress Resp: diminished breath sounds bilaterally Cardio: regular rate and rhythm, S1, S2 normal, no murmur, click, rub or gallop GI: soft, non-tender; bowel sounds normal; no masses,  no organomegaly Extremities: extremities normal, atraumatic, no cyanosis or edema  Lab Results:  Recent Labs  01/17/14 2115 01/18/14 0432  WBC 7.6 12.4*  HGB 10.6* 11.2*  HCT 31.6* 33.5*  PLT 163 127*   BMET:  Recent Labs  01/17/14 2115 01/18/14 0432  NA 143 139  K 5.0 4.0  CL 101 97  CO2 26 28  GLUCOSE 85 120*  BUN 60* 35*  CREATININE 10.92* 7.14*  CALCIUM 9.4 9.2   No results found for this basename: PTH,  in the last 72 hours Iron Studies: No results found for this basename: IRON, TIBC, TRANSFERRIN, FERRITIN,  in the last 72 hours  Studies/Results: Dg Chest Port 1 View  01/17/2014   CLINICAL DATA:  Shortness of breath.  EXAM: PORTABLE CHEST - 1 VIEW  COMPARISON:  None.  FINDINGS: There is marked enlargement of the cardiopericardial silhouette with pulmonary edema. No pneumothorax or pleural effusion. Surgical clips right axilla noted.  IMPRESSION: Congestive heart failure.   Electronically Signed   By: Drusilla Kannerhomas  Dalessio M.D.   On: 01/17/2014 21:51    I have reviewed the patient's  current medications.  Assessment/Plan: Problem #1 difficulty in breathing possibly combination of CHF and also COPD. Patient is status post her hemodialysis with 3 L ultrafiltration. Presently feels much better . Problem #2 end-stage renal disease status post hemodialysis. Patient does not have any nausea vomiting and his potassium is good.  Problem #3 anemia: History of and hematocrit is range Problem #4 hypertension: His blood pressure is reasonably controlled Problem #5 Fever: Patient presently  febrile. His white cell count is also elevated. Source of infection not clear. His chest x-ray shows only CHF. Presently blood culture is being done. Problem #6 metabolic bone disease: His calcium and phosphorus isn't range. Plan: We'll make arrangements for patient to get dialysis tomorrow Possibly patient may need to be covered with antibiotics empirically. Once patient is discharged to go back to his primary nephrologist as outpatient.   LOS: 1 day   Doryan Bahl S 01/18/2014,7:20 AM

## 2014-01-18 NOTE — Progress Notes (Signed)
The patient is a 68 year old man with end-stage renal disease and hypertension, who was admitted early this morning by Dr. Onalee Huaavid for shortness of breath. Please see her history and physical dictated. The patient was briefly seen and examined. His chart, vital signs, laboratory studies were reviewed. Currently, he has less shortness of breath. He was dialyzed early this morning. Nephrology continued to follow him. He ran a low-grade fever this morning. He denies diarrhea or prerenal cough. He no longer makes urine. Blood cultures have been ordered. We'll start vancomycin empirically. Will order a followup chest x-ray tomorrow morning. We'll also order total iron and TSH to assess his anemia. We'll also order a vitamin B12 level to assess his thrombocytopenia. Of note, the patient is [place] vacation from [place]. Following discharge, he plans to travel back to [place] via bus.  The patient may need 1-2 more days of hospitalization before discharge. Nephrology plans to dialyze him tomorrow.

## 2014-01-18 NOTE — Care Management Note (Addendum)
    Page 1 of 1   01/19/2014     4:24:27 PM CARE MANAGEMENT NOTE 01/19/2014  Patient:  Timothy Rodgers,Hermen B   Account Number:  [id number]  Date Initiated:  01/18/2014  Documentation initiated by:  Kathyrn SheriffHILDRESS,JESSICA  Subjective/Objective Assessment:   Patient admitted with SOB. Pt recieved dialysis T/Th/Sa through [facility]. Patient from out of state and visiting childres. Patient will be discharged home and will stay with son until his return to [place]. Patient independent with ADL's.     Action/Plan:   Patient has no CM needs at this time.   Anticipated DC Date:     Anticipated DC Plan:        DC Planning Services  CM consult      Choice offered to / List presented to:             Status of service:  Completed, signed off Medicare Important Message given?   (If response is "NO", the following Medicare IM given date fields will be blank) Date Medicare IM given:   Medicare IM given by:   Date Additional Medicare IM given:   Additional Medicare IM given by:    Discharge Disposition:    Per UR Regulation:    If discussed at Long Length of Stay Meetings, dates discussed:    Comments:   07/22/2013 1620 Kathyrn SheriffJessica Childress, RN, MSN, PCCN Patient being discharged home with self care. [facility]. No CM needs noted.  01/18/2014 1550 Kathyrn SheriffJessica Childress, RN, MSN, [facility]

## 2014-01-19 ENCOUNTER — Inpatient Hospital Stay (HOSPITAL_COMMUNITY): Payer: Medicaid - Out of State

## 2014-01-19 DIAGNOSIS — N186 End stage renal disease: Secondary | ICD-10-CM

## 2014-01-19 DIAGNOSIS — J810 Acute pulmonary edema: Secondary | ICD-10-CM

## 2014-01-19 DIAGNOSIS — G40909 Epilepsy, unspecified, not intractable, without status epilepticus: Secondary | ICD-10-CM

## 2014-01-19 DIAGNOSIS — J9600 Acute respiratory failure, unspecified whether with hypoxia or hypercapnia: Secondary | ICD-10-CM

## 2014-01-19 DIAGNOSIS — R509 Fever, unspecified: Secondary | ICD-10-CM

## 2014-01-19 LAB — CBC
HEMATOCRIT: 29 % — AB (ref 39.0–52.0)
Hemoglobin: 9.8 g/dL — ABNORMAL LOW (ref 13.0–17.0)
MCH: 29.3 pg (ref 26.0–34.0)
MCHC: 33.8 g/dL (ref 30.0–36.0)
MCV: 86.6 fL (ref 78.0–100.0)
PLATELETS: 103 10*3/uL — AB (ref 150–400)
RBC: 3.35 MIL/uL — ABNORMAL LOW (ref 4.22–5.81)
RDW: 16.6 % — ABNORMAL HIGH (ref 11.5–15.5)
WBC: 6.9 10*3/uL (ref 4.0–10.5)

## 2014-01-19 LAB — BASIC METABOLIC PANEL
Anion gap: 16 — ABNORMAL HIGH (ref 5–15)
BUN: 53 mg/dL — AB (ref 6–23)
CALCIUM: 9 mg/dL (ref 8.4–10.5)
CO2: 26 meq/L (ref 19–32)
Chloride: 96 mEq/L (ref 96–112)
Creatinine, Ser: 9.63 mg/dL — ABNORMAL HIGH (ref 0.50–1.35)
GFR calc Af Amer: 6 mL/min — ABNORMAL LOW (ref >90)
GFR calc non Af Amer: 5 mL/min — ABNORMAL LOW (ref >90)
GLUCOSE: 81 mg/dL (ref 70–99)
Potassium: 4.7 mEq/L (ref 3.7–5.3)
SODIUM: 138 meq/L (ref 137–147)

## 2014-01-19 MED ORDER — EPOETIN ALFA 10000 UNIT/ML IJ SOLN
10000.0000 [IU] | INTRAMUSCULAR | Status: DC
  Administered 2014-01-19: 10000 [IU] via INTRAVENOUS

## 2014-01-19 MED ORDER — SODIUM CHLORIDE 0.9 % IV SOLN: 100.0000 mL | INTRAVENOUS | Status: DC | PRN

## 2014-01-19 NOTE — Progress Notes (Signed)
UR chart review completed.  

## 2014-01-19 NOTE — Progress Notes (Signed)
Patient up to chair. Tolerating well at this time. No complaints voiced.

## 2014-01-19 NOTE — Procedures (Signed)
   HEMODIALYSIS TREATMENT NOTE:  3.5 hour heparin-free dialysis completed via right chest wall AVG (15g/antegrade).  Goal met:  Tolerated removal of 3.4 liters without interruption in ultrafiltration.  Pt received Epogen 10Ku and Vancomycin $RemoveBefore'750mg'[id number]$  with HD. All blood was reinfused and hemostasis was achieved within 15 minutes.  Report called to Jomarie Longs, RN.  Dublin Grayer L. Jovi Alvizo, RN, CDN

## 2014-01-19 NOTE — Progress Notes (Signed)
[facility] PROGRESS NOTE  CHISUM HABENICHT ZOX:[id number] DOB: 01-Jul-1945 DOA: 01/17/2014 PCP: No primary provider on file.  Assessment/Plan: 1. Acute respiratory failure related to volume overload. Improving status post dialysis. He still has evidence of volume overload on chest x-ray. He'll be dialyzed again today. Plans are for followup with outpatient dialysis tomorrow. 2. Acute pulmonary edema. Likely related to missed dialysis sessions due to patient's traveling from [place]. He's also had dietary indiscretions. Improving with hemodialysis. 3. End-stage renal disease on hemodialysis. Per nephrology 4. Anemia, no evidence of bleeding. Evidence of iron deficiency per anemia panel. This can be further worked up in the outpatient setting 5. Hypertension. On lisinopril and Coreg. Also started on Procardia. 6. Low-grade fever. Patient had 1 episode of a low-grade fever yesterday. He does not have a significant leukocytosis. He does not appear septic or toxic. Blood cultures were drawn yesterday and have shown no growth to this point. He was started empirically on vancomycin yesterday, we'll likely discontinue if he remains stable.  Code Status: full code Family Communication: discussed with patient Disposition Plan: discharge home once improved   Consultants:  Nephrology  Procedures:    Antibiotics:  Vancomycin 7/28>>  HPI/Subjective: Patient is feeling better, feels breathing is improving. No cough or chest pain   Objective: Filed Vitals:   01/19/14 0800  BP: 132/69  Pulse: 76  Temp: 98.2 F (36.8 C)  Resp: 15    Intake/Output Summary (Last 24 hours) at 01/19/14 0923 Last data filed at 01/19/14 0800  Gross per 24 hour  Intake    860 ml  Output      0 ml  Net    860 ml   Filed Weights   01/18/14 0003 01/18/14 0320 01/19/14 0500  Weight: 63.4 kg (139 lb 12.4 oz) 60.6 kg (133 lb 9.6 oz) 63.7 kg (140 lb 6.9 oz)    Exam:   General:   NAD  Cardiovascular: s1, s2, rrr  Respiratory: crackles at bases  Abdomen: soft, nt, nd, bs+  Musculoskeletal: no edema bilaterally  Data Reviewed: Basic Metabolic Panel:  Recent Labs Lab 01/17/14 2115 01/17/14 2242 01/18/14 0432 01/19/14 0514  NA 143  --  139 138  K 5.0  --  4.0 4.7  CL 101  --  97 96  CO2 26  --  28 26  GLUCOSE 85  --  120* 81  BUN 60*  --  35* 53*  CREATININE 10.92*  --  7.14* 9.63*  CALCIUM 9.4  --  9.2 9.0  PHOS  --  3.8  --   --    Liver Function Tests: No results found for this basename: AST, ALT, ALKPHOS, BILITOT, PROT, ALBUMIN,  in the last 168 hours No results found for this basename: LIPASE, AMYLASE,  in the last 168 hours No results found for this basename: AMMONIA,  in the last 168 hours CBC:  Recent Labs Lab 01/17/14 2115 01/18/14 0432 01/19/14 0514  WBC 7.6 12.4* 6.9  HGB 10.6* 11.2* 9.8*  HCT 31.6* 33.5* 29.0*  MCV 87.5 88.2 86.6  PLT 163 127* 103*   Cardiac Enzymes:  Recent Labs Lab 01/17/14 2115  TROPONINI <0.30   BNP (last 3 results)  Recent Labs  01/17/14 2115  PROBNP 54522.0*   CBG: No results found for this basename: GLUCAP,  in the last 168 hours  Recent Results (from the past 240 hour(s))  MRSA PCR SCREENING     Status: None   Collection Time  01/17/14 11:40 PM      Result Value Ref Range Status   MRSA by PCR NEGATIVE  NEGATIVE Final   Comment:            The GeneXpert MRSA Assay (FDA     approved for NASAL specimens     only), is one component of a     comprehensive MRSA colonization     surveillance program. It is not     intended to diagnose MRSA     infection nor to guide or     monitor treatment for     MRSA infections.  CULTURE, BLOOD (ROUTINE X 2)     Status: None   Collection Time    01/18/14  7:26 AM      Result Value Ref Range Status   Specimen Description BLOOD RIGHT HAND   Final   Special Requests BOTTLES DRAWN AEROBIC AND ANAEROBIC 7CC   Final   Culture NO GROWTH <24 HRS    Final   Report Status PENDING   Incomplete  CULTURE, BLOOD (ROUTINE X 2)     Status: None   Collection Time    01/18/14  7:26 AM      Result Value Ref Range Status   Specimen Description BLOOD RIGHT ARM   Final   Special Requests BOTTLES DRAWN AEROBIC AND ANAEROBIC 6CC   Final   Culture NO GROWTH <24 HRS   Final   Report Status PENDING   Incomplete     Studies: Nm Pulmonary Perf And Vent  01/18/2014   CLINICAL DATA:  Shortness of breath, history hypertension, end-stage renal disease on dialysis  EXAM: NUCLEAR MEDICINE VENTILATION - PERFUSION LUNG SCAN  TECHNIQUE: Ventilation images were obtained in multiple projections using inhaled aerosol technetium 99 M DTPA. Perfusion images were obtained in multiple projections after intravenous injection of Tc-70m MAA.  RADIOPHARMACEUTICALS:  40 mCi Tc-21m DTPA aerosol inhalation and 6 mCi Tc-3m MAA IV  COMPARISON:  None; correlation chest radiograph 01/17/2014  FINDINGS: Ventilation: Diminished ventilation RIGHT lower lobe. Central airway deposition of tracer. Enlargement of cardiac silhouette.  Perfusion: Enlargement of cardiac silhouette. Prominent hila. Artifacts from patient's arms on lateral views. No segmental or subsegmental perfusion defects. Perfusion is normal at the site of diminished ventilation in the RIGHT lower lobe. Minimal irregular perfusion laterally in RIGHT middle lobe region.  Chest radiograph: Enlargement of cardiac silhouette with pulmonary vascular congestion and perihilar edema. More focal opacity is seen in the RIGHT lower lobe which could represent asymmetrically increased edema or a coexistent consolidation.  IMPRESSION: Very low probability for pulmonary embolism.   Electronically Signed   By: Ulyses Southward M.D.   On: 01/18/2014 08:42   Dg Chest Port 1 View  01/19/2014   CLINICAL DATA:  End-stage renal disease  EXAM: PORTABLE CHEST - 1 VIEW  COMPARISON:  January 17, 2014  FINDINGS: There has been interval partial clearing of  interstitial edema. Mild edema does remain. No airspace consolidation. There is cardiomegaly with pulmonary venous hypertension. No adenopathy. There are surgical clips in the right axilla  IMPRESSION: Congestive heart failure remains, although there is less interstitial edema compared to recent prior study. No new opacity.   Electronically Signed   By: Bretta Bang M.D.   On: 01/19/2014 07:54   Dg Chest Port 1 View  01/17/2014   CLINICAL DATA:  Shortness of breath.  EXAM: PORTABLE CHEST - 1 VIEW  COMPARISON:  None.  FINDINGS: There is marked enlargement of the cardiopericardial  silhouette with pulmonary edema. No pneumothorax or pleural effusion. Surgical clips right axilla noted.  IMPRESSION: Congestive heart failure.   Electronically Signed   By: Drusilla Kannerhomas  Dalessio M.D.   On: 01/17/2014 21:51    Scheduled Meds: . allopurinol  100 mg Oral Daily  . antiseptic oral rinse  15 mL Mouth Rinse BID  . carvedilol  6.25 mg Oral BID WC  . epoetin alfa  10,000 Units Intravenous Q M,W,F-HD  . ipratropium-albuterol  3 mL Nebulization BID  . lisinopril  20 mg Oral Daily  . NIFEdipine  90 mg Oral Daily  . ondansetron  4 mg Oral TID  . phenytoin  200 mg Oral Daily  . phenytoin  300 mg Oral QHS  . predniSONE  5 mg Oral Q breakfast  . sevelamer carbonate  1,600 mg Oral TID WC  . sodium chloride  3 mL Intravenous Q12H  . vancomycin  500 mg Intravenous Q M,W,F-HD   Continuous Infusions:   Principal Problem:   Pulmonary edema Active Problems:   Kidney disease, chronic, end stage on dialysis   Acute respiratory failure with hypoxia   SOB (shortness of breath)   Fever, unspecified   HTN (hypertension)   Thrombocytopenia, unspecified   Anemia in chronic kidney disease   Seizure disorder    Time spent: 30mins    [facility] CharlestonMEMON,[place]  [facility] Pager [phone]. If 7PM-7AM, please contact night-coverage at [email or web address], password [email or web address] 01/19/2014, 9:23 AM  LOS: 2 days

## 2014-01-19 NOTE — Progress Notes (Signed)
Timothy Rodgers  MRN: [id number]  DOB/AGE: 1945-11-21 68 y.o.  Primary Care Physician:No primary provider on file.  Admit date: 01/17/2014  Chief Complaint:  Chief Complaint  Patient presents with  . Shortness of Breath    S-Pt presented on  01/17/2014 with  Chief Complaint  Patient presents with  . Shortness of Breath  .    Pt today feels better  Meds . allopurinol  100 mg Oral Daily  . antiseptic oral rinse  15 mL Mouth Rinse BID  . carvedilol  6.25 mg Oral BID WC  . epoetin alfa  10,000 Units Intravenous Q M,W,F-HD  . ipratropium-albuterol  3 mL Nebulization BID  . lisinopril  20 mg Oral Daily  . NIFEdipine  90 mg Oral Daily  . ondansetron  4 mg Oral TID  . phenytoin  200 mg Oral Daily  . phenytoin  300 mg Oral QHS  . predniSONE  5 mg Oral Q breakfast  . sevelamer carbonate  1,600 mg Oral TID WC  . sodium chloride  3 mL Intravenous Q12H  . vancomycin  500 mg Intravenous Q M,W,F-HD      Physical Exam: Vital signs in last 24 hours: Temp:  [98 F (36.7 C)-98.8 F (37.1 C)] 98.2 F (36.8 C) (07/29 0800) Pulse Rate:  [76-87] 76 (07/29 0800) Resp:  [15-25] 15 (07/29 0800) BP: (114-132)/(65-82) 132/69 mmHg (07/29 0800) SpO2:  [100 %] 100 % (07/29 0800) Weight:  [140 lb 6.9 oz (63.7 kg)] 140 lb 6.9 oz (63.7 kg) (07/29 0500) Weight change: 10 lb 6.9 oz (4.732 kg) Last BM Date: 01/18/14  Intake/Output from previous day: 07/28 0701 - 07/29 0700 In: 600 [P.O.:600] Out: -  Total I/O In: 500 [P.O.:500] Out: -    Physical Exam: General- pt is awake,alert, oriented to time place and person Resp- No acute REsp distress, CTA B/L NO Rhonchi CVS- S1S2 regular ij rate and rhythm GIT- BS+, soft, NT, ND EXT- NO LE Edema, Cyanosis Access -Chest AVG+  Lab Results: CBC  Recent Labs  01/18/14 0432 01/19/14 0514  WBC 12.4* 6.9  HGB 11.2* 9.8*  HCT 33.5* 29.0*  PLT 127* 103*    BMET  Recent Labs  01/18/14 0432 01/19/14 0514  NA 139 138  K 4.0 4.7  CL  97 96  CO2 28 26  GLUCOSE 120* 81  BUN 35* 53*  CREATININE 7.14* 9.63*  CALCIUM 9.2 9.0    MICRO Recent Results (from the past 240 hour(s))  MRSA PCR SCREENING     Status: None   Collection Time    01/17/14 11:40 PM      Result Value Ref Range Status   MRSA by PCR NEGATIVE  NEGATIVE Final   Comment:            The GeneXpert MRSA Assay (FDA     approved for NASAL specimens     only), is one component of a     comprehensive MRSA colonization     surveillance program. It is not     intended to diagnose MRSA     infection nor to guide or     monitor treatment for     MRSA infections.  CULTURE, BLOOD (ROUTINE X 2)     Status: None   Collection Time    01/18/14  7:26 AM      Result Value Ref Range Status   Specimen Description BLOOD RIGHT HAND   Final   Special Requests BOTTLES DRAWN AEROBIC AND ANAEROBIC  [facility]   Final   Culture NO GROWTH <24 HRS   Final   Report Status PENDING   Incomplete  CULTURE, BLOOD (ROUTINE X 2)     Status: None   Collection Time    01/18/14  7:26 AM      Result Value Ref Range Status   Specimen Description BLOOD RIGHT ARM   Final   Special Requests BOTTLES DRAWN AEROBIC AND ANAEROBIC 6CC   Final   Culture NO GROWTH <24 HRS   Final   Report Status PENDING   Incomplete      Lab Results  Component Value Date   CALCIUM 9.0 01/19/2014   PHOS 3.8 01/17/2014     Impression: 1)Renal  ESRd on HD ESRD since 20 + years S/p Failed transplant Now on HD -On TTS as outpt schedule Missed few days in recent past sec to Bus travel from [place] and then clotted access. Pt was dialyzed yesterday and will need it again today. Admitted to Dietary indiscretion on his vacation.   2)HTN  Medication- On RAS blockers- On Calcium Channel Blockers On Alpha and beta Blockers  3)Anemia HGb at goal (9--11)   4)CKD Mineral-Bone Disorder Phosphorus at goal. Calcium at goal.  5)Hypervolemia Admitted with Fluid overload  Now better  6)Electrolytes   Normokalemic NOrmonatremic   7)Acid base Co2 at goal     Plan:  Will dialyze today.     Barry Faircloth S 01/19/2014, 9:00 AM

## 2014-01-19 NOTE — Progress Notes (Signed)
Patient returned from dialysis. Vital signs stable. No complaints voiced. Ambulated patient in hallway on room air. Resting O2 sat 100%. Ambulating O2 sat on room air 98%. Dr. Kerry HoughMemon paged.

## 2014-01-19 NOTE — Discharge Instructions (Signed)
Pulmonary Edema ° °Pulmonary edema (PE) is a condition in which fluid collects in the lungs. This makes it hard to breathe. PE may be a result of the heart not pumping very well or a result of injury.  °CAUSES  °· Coronary artery disease causes blockages in the arteries of the heart. This deprives the heart muscle of oxygen and weakens the muscle. A heart attack is a form of coronary artery disease. °· High blood pressure causes the heart muscle to work harder than usual. Over time, the heart muscle may get stiff, and it starts to work less efficiently. It may also fatigue and weaken. °· Viral infection of the heart (myocarditis) may weaken the heart muscle. °· Metabolic conditions such as thyroid disease, excessive alcohol use, certain vitamin deficiencies, or diabetes may also weaken the heart muscle. °· Leaky or stiff heart valves may impair normal heart function. °· Lung disease may strain the heart muscle. °· Excessive demands on the heart such as too much salt or fluid intake. °· Failure to take prescribed medicines. °· Lung injury from heat or toxins, such as poisonous gas. °· Infection in the lungs or other parts of the body. °· Fluid overload caused by kidney failure or medicines. °SYMPTOMS  °· Shortness of breath at rest or with exertion. °· Grunting, wheezing, or gurgling while breathing. °· Feeling like you cannot get enough air. °· Breaths are shallow and fast. °· A lot of coughing with frothy or bloody mucus. °· Skin may become cool, damp, and turn a pale or bluish color. °DIAGNOSIS  °Initial diagnosis may be based on your history, symptoms, and a physical examination. Additional tests for PE may include: °· Electrocardiography. °· Chest X-ray. °· Blood tests. °· Stress test. °· Ultrasound evaluation of the heart (echocardiography). °· Evaluation by a heart doctor (cardiologist). °· Test of the heart arteries to look for blockages (angiography). °· Check of blood oxygen. °TREATMENT  °Treatment of PE  will depend on the underlying cause and will focus first on relieving the symptoms.  °· Extra oxygen to make breathing easier and assist with removing mucus. This may include breathing treatments or a tube into the lungs and a breathing machine. °· Medicine to help the body get rid of extra water, usually through an IV tube. °· Medicine to help the heart pump better. °· If poor heart function is the cause, treatment may include: °¨ Procedures to open blocked arteries, repair damaged heart valves, or remove some of the damaged heart muscle. °¨ A pacemaker to help the heart pump with less effort. °HOME CARE INSTRUCTIONS  °· Your health care provider will help you determine what type of exercise program may be helpful. It is important to maintain strength and increase it if possible. Pace your activities to avoid shortness of breath or chest pain. Rest for at least 1 hour before and after meals. Cardiac rehabilitation programs are available in some locations. °· Eat a heart-healthy diet low in salt, saturated fat, and cholesterol. Ask for help with choices. °· Make a list of every medicine, vitamin, or herbal supplement you are taking. Keep the list with you at all times. Show it to your health care provider at every visit and before starting a new medicine. Keep the list up to date. °· Ask your health care provider or pharmacist to help you write a plan or schedule so that you know things about each medicine such as: °¨ Why you are taking it. °¨ The possible side effects. °¨   The best time of day to take it. °¨ Foods to take with it or avoid. °¨ When to stop taking it. °· Record your hospital or clinic weight. When you get home, compare it to your scale and record your weight. Then, weigh yourself first thing in the morning daily, and record the weights. You should weigh yourself every morning after you urinate and before you eat breakfast. Wear the same amount of clothing each time you weigh yourself. Provide your  health care provider with your weight record. Daily weights are important in the early recognition of excess fluid. Tell your health care provider right away if you have gained 03 lb/1.4 kg in 1 day, 05 lb/2.3 kg in a week, or as directed by your health care provider. Your medicines may need to be adjusted. °· Blood pressure monitoring should be done as often as directed. You can get a home blood pressure cuff at your drugstore. Record these values and bring them with you for your clinic visits. Notify your health care provider if you become dizzy or light-headed when standing up. °· If you are currently a smoker, it is time to quit. Nicotine makes your heart work harder and is one of the leading causes of cardiac deaths. Do not use nicotine gum or patches before talking to your doctor. °· Make a follow-up appointment with your health care provider as directed. °· Ask your health care provider for a copy of your latest heart tracing (ECG) and keep a copy with you at all times. °SEEK IMMEDIATE MEDICAL CARE IF:  °· You have severe chest pain, especially if the pain is crushing or pressure-like and spreads to the arms, back, neck, or jaw. THIS IS AN EMERGENCY. Do not wait to see if the pain will go away. Call for local emergency medical help. Do not drive yourself to the hospital. °· You have sweating, feel sick to your stomach (nauseous), or are experiencing shortness of breath. °· Your weight increases by 03 lb/1.4 kg in 1 day or 05 lb/2.3 kg in a week. °· You notice increasing shortness of breath that is unusual for you. This may happen during rest, sleep, or with activity. °· You develop chest pain (angina) or pain that is unusual for you. °· You notice more swelling in your hands, feet, ankles, or abdomen. °· You notice lasting (persistent) dizziness, blurred vision, headache, or unsteadiness. °· You begin to cough up bloody mucus (sputum). °· You are unable to sleep because it is hard to breathe. °· You begin to  feel a "jumping" or "fluttering" sensation (palpitations) in the chest that is unusual for you. °MAKE SURE YOU: °· Understand these instructions. °· Will watch your condition. °· Will get help right away if you are not doing well or get worse. °Document Released: 08/31/2002 Document Revised: 06/15/2013 Document Reviewed: 02/15/2013 °[employer]® Patient Information ©2015 [employer]. This information is not intended to replace advice given to you by your health care provider. Make sure you discuss any questions you have with your health care provider. ° °

## 2014-01-19 NOTE — Progress Notes (Signed)
Patient taken to dialysis at this time via bed

## 2014-01-19 NOTE — Progress Notes (Signed)
Pt is to be discharged home today. Pt is in NAD, IV is out, all paperwork has been reviewed/discussed with patient, and there are no questions/concerns at this time. Assessment is unchanged from this morning. Pt is to be accompanied downstairs by staff and family via wheelchair.  

## 2014-01-19 NOTE — Progress Notes (Signed)
Patient pending a discharge after dialysis. OK per Dr. Kerry HoughMemon for patient to be taken off telemetry and taken to dialysis room for treatment.

## 2014-01-19 NOTE — Discharge Summary (Addendum)
Physician Discharge Summary  Timothy Rodgers UVO:[id number] DOB: Oct 24, 1945 DOA: 01/17/2014  PCP: No primary provider on file.  Admit date: 01/17/2014 Discharge date: 01/19/2014  Time spent: 40 minutes  Recommendations for Outpatient Follow-up:  1. Follow up with dialysis center [facility]) tomorrow for scheduled dialysis  Discharge Diagnoses:  Principal Problem:   Pulmonary edema related to end-stage renal disease Active Problems:   Kidney disease, chronic, end stage on dialysis   Acute respiratory failure with hypoxia   SOB (shortness of breath)   Fever, unspecified   HTN (hypertension)   Thrombocytopenia, unspecified   Anemia in chronic kidney disease   Seizure disorder   Discharge Condition: improved  Diet recommendation: low salt  Filed Weights   01/19/14 0500 01/19/14 1030 01/19/14 1430  Weight: 63.7 kg (140 lb 6.9 oz) 60.6 kg (133 lb 9.6 oz) 57 kg (125 lb 10.6 oz)    History of present illness and Hospital Course:  This patient was admitted to the hospital with progressive shortness of breath. He has end-stage renal disease on hemodialysis and had traveled to [place] from [place] over a 3 day bus trip. He also admitted to some dietary indiscretions. He missed several dialysis sessions. When he to the emergency room he was noted to be significantly hypoxic. He underwent dialysis and required BiPAP therapy. With dialysis, his respiratory status significantly improved. He was able to wean down to room air and is now ambulating without any shortness of breath. He feels back to his baseline. He did have a mild temperature 100.7. Blood cultures were sent and he was started empirically on vancomycin. Blood cultures did not show any significant growth and he did not have any focus of infection. Patient did not appear to be septic or toxic. Since he did not have any clear source of infection, antibiotics were discontinued. He did not have any recurrence of fever. Case was  discussed with nephrology and is felt is appropriate for discharge home. He will followup with a local dialysis center during his time here in [place].  Procedures:    Consultations:  Nephrology  Discharge Exam: Filed Vitals:   01/19/14 1505  BP: 138/76  Pulse:   Temp:   Resp:     General: NAD Cardiovascular: S1, S2 RRR Respiratory: crackles at bases  Discharge Instructions You were cared for by a hospitalist during your hospital stay. If you have any questions about your discharge medications or the care you received while you were in the hospital after you are discharged, you can call the unit and asked to speak with the hospitalist on call if the hospitalist that took care of you is not available. Once you are discharged, your primary care physician will handle any further medical issues. Please note that NO REFILLS for any discharge medications will be authorized once you are discharged, as it is imperative that you return to your primary care physician (or establish a relationship with a primary care physician if you do not have one) for your aftercare needs so that they can reassess your need for medications and monitor your lab values.  Discharge Instructions   Call MD for:  difficulty breathing, headache or visual disturbances    Complete by:  As directed      Call MD for:  severe uncontrolled pain    Complete by:  As directed      Call MD for:  temperature >100.4    Complete by:  As directed      Diet - low  sodium heart healthy    Complete by:  As directed      Increase activity slowly    Complete by:  As directed             Medication List         acetaminophen 325 MG tablet  Commonly known as:  TYLENOL  Take 650 mg by mouth every 6 (six) hours as needed.     allopurinol 100 MG tablet  Commonly known as:  ZYLOPRIM  Take 100 mg by mouth daily.     b complex-vitamin c-folic acid 0.8 MG Tabs tablet  Take 1 tablet by mouth daily.     carvedilol 6.25 MG  tablet  Commonly known as:  COREG  Take 6.25 mg by mouth 2 (two) times daily.     ipratropium 17 MCG/ACT inhaler  Commonly known as:  ATROVENT HFA  Inhale 1 puff into the lungs every 6 (six) hours.     lisinopril 20 MG tablet  Commonly known as:  PRINIVIL,ZESTRIL  Take 20 mg by mouth daily.     NIFEdipine 90 MG 24 hr tablet  Commonly known as:  PROCARDIA XL/ADALAT-CC  Take 90 mg by mouth daily.     ondansetron 4 MG tablet  Commonly known as:  ZOFRAN  Take 4 mg by mouth 3 (three) times daily.     phenytoin 100 MG ER capsule  Commonly known as:  DILANTIN  Take 200-300 mg by mouth 2 (two) times daily. Two in the morning and three at bedtime     predniSONE 5 MG tablet  Commonly known as:  DELTASONE  Take 5 mg by mouth daily with breakfast.     sevelamer carbonate 800 MG tablet  Commonly known as:  RENVELA  Take 800-1,600 mg by mouth 3 (three) times daily with meals. 2 with meals and one with snacks       No Known Allergies     Follow-up Information   Follow up with with dialysis tomorrow.       The results of significant diagnostics from this hospitalization (including imaging, microbiology, ancillary and laboratory) are listed below for reference.    Significant Diagnostic Studies: Nm Pulmonary Perf And Vent  01/18/2014   CLINICAL DATA:  Shortness of breath, history hypertension, end-stage renal disease on dialysis  EXAM: NUCLEAR MEDICINE VENTILATION - PERFUSION LUNG SCAN  TECHNIQUE: Ventilation images were obtained in multiple projections using inhaled aerosol technetium 99 M DTPA. Perfusion images were obtained in multiple projections after intravenous injection of Tc-4347m MAA.  RADIOPHARMACEUTICALS:  40 mCi Tc-5647m DTPA aerosol inhalation and 6 mCi Tc-1147m MAA IV  COMPARISON:  None; correlation chest radiograph 01/17/2014  FINDINGS: Ventilation: Diminished ventilation RIGHT lower lobe. Central airway deposition of tracer. Enlargement of cardiac silhouette.  Perfusion:  Enlargement of cardiac silhouette. Prominent hila. Artifacts from patient's arms on lateral views. No segmental or subsegmental perfusion defects. Perfusion is normal at the site of diminished ventilation in the RIGHT lower lobe. Minimal irregular perfusion laterally in RIGHT middle lobe region.  Chest radiograph: Enlargement of cardiac silhouette with pulmonary vascular congestion and perihilar edema. More focal opacity is seen in the RIGHT lower lobe which could represent asymmetrically increased edema or a coexistent consolidation.  IMPRESSION: Very low probability for pulmonary embolism.   Electronically Signed   By: Ulyses SouthwardMark  Boles M.D.   On: 01/18/2014 08:42   Dg Chest Port 1 View  01/19/2014   CLINICAL DATA:  End-stage renal disease  EXAM: PORTABLE CHEST -  1 VIEW  COMPARISON:  January 17, 2014  FINDINGS: There has been interval partial clearing of interstitial edema. Mild edema does remain. No airspace consolidation. There is cardiomegaly with pulmonary venous hypertension. No adenopathy. There are surgical clips in the right axilla  IMPRESSION: Congestive heart failure remains, although there is less interstitial edema compared to recent prior study. No new opacity.   Electronically Signed   By: Bretta Bang M.D.   On: 01/19/2014 07:54   Dg Chest Port 1 View  01/17/2014   CLINICAL DATA:  Shortness of breath.  EXAM: PORTABLE CHEST - 1 VIEW  COMPARISON:  None.  FINDINGS: There is marked enlargement of the cardiopericardial silhouette with pulmonary edema. No pneumothorax or pleural effusion. Surgical clips right axilla noted.  IMPRESSION: Congestive heart failure.   Electronically Signed   By: Drusilla Kanner M.D.   On: 01/17/2014 21:51    Microbiology: Recent Results (from the past 240 hour(s))  MRSA PCR SCREENING     Status: None   Collection Time    01/17/14 11:40 PM      Result Value Ref Range Status   MRSA by PCR NEGATIVE  NEGATIVE Final   Comment:            The GeneXpert MRSA Assay (FDA      approved for NASAL specimens     only), is one component of a     comprehensive MRSA colonization     surveillance program. It is not     intended to diagnose MRSA     infection nor to guide or     monitor treatment for     MRSA infections.  CULTURE, BLOOD (ROUTINE X 2)     Status: None   Collection Time    01/18/14  7:26 AM      Result Value Ref Range Status   Specimen Description BLOOD RIGHT HAND   Final   Special Requests BOTTLES DRAWN AEROBIC AND ANAEROBIC 7CC   Final   Culture NO GROWTH 1 DAY   Final   Report Status PENDING   Incomplete  CULTURE, BLOOD (ROUTINE X 2)     Status: None   Collection Time    01/18/14  7:26 AM      Result Value Ref Range Status   Specimen Description BLOOD RIGHT ARM   Final   Special Requests BOTTLES DRAWN AEROBIC AND ANAEROBIC 6CC   Final   Culture NO GROWTH 1 DAY   Final   Report Status PENDING   Incomplete     Labs: Basic Metabolic Panel:  Recent Labs Lab 01/17/14 2115 01/17/14 2242 01/18/14 0432 01/19/14 0514  NA 143  --  139 138  K 5.0  --  4.0 4.7  CL 101  --  97 96  CO2 26  --  28 26  GLUCOSE 85  --  120* 81  BUN 60*  --  35* 53*  CREATININE 10.92*  --  7.14* 9.63*  CALCIUM 9.4  --  9.2 9.0  PHOS  --  3.8  --   --    Liver Function Tests: No results found for this basename: AST, ALT, ALKPHOS, BILITOT, PROT, ALBUMIN,  in the last 168 hours No results found for this basename: LIPASE, AMYLASE,  in the last 168 hours No results found for this basename: AMMONIA,  in the last 168 hours CBC:  Recent Labs Lab 01/17/14 2115 01/18/14 0432 01/19/14 0514  WBC 7.6 12.4* 6.9  HGB 10.6* 11.2* 9.8*  HCT 31.6* 33.5* 29.0*  MCV 87.5 88.2 86.6  PLT 163 127* 103*   Cardiac Enzymes:  Recent Labs Lab 01/17/14 2115  TROPONINI <0.30   BNP: BNP (last 3 results)  Recent Labs  01/17/14 2115  PROBNP 54522.0*   CBG: No results found for this basename: GLUCAP,  in the last 168 hours     Signed:  Lambert Jeanty  [facility] 01/19/2014, 8:03 PM

## 2014-01-23 LAB — CULTURE, BLOOD (ROUTINE X 2)
Culture: NO GROWTH
Culture: NO GROWTH

## 2014-02-15 ENCOUNTER — Inpatient Hospital Stay (HOSPITAL_COMMUNITY)
Admission: EM | Admit: 2014-02-15 | Discharge: 2014-02-17 | DRG: 189 | Disposition: A | Discharge status: Home / Self Care | Payer: Medicare Other | Attending: Internal Medicine | Admitting: Internal Medicine

## 2014-02-15 ENCOUNTER — Emergency Department (HOSPITAL_COMMUNITY): Payer: Medicare Other

## 2014-02-15 ENCOUNTER — Encounter (HOSPITAL_COMMUNITY): Payer: Self-pay | Admitting: Emergency Medicine

## 2014-02-15 DIAGNOSIS — I319 Disease of pericardium, unspecified: Secondary | ICD-10-CM

## 2014-02-15 DIAGNOSIS — R0989 Other specified symptoms and signs involving the circulatory and respiratory systems: Secondary | ICD-10-CM

## 2014-02-15 DIAGNOSIS — N189 Chronic kidney disease, unspecified: Secondary | ICD-10-CM

## 2014-02-15 DIAGNOSIS — J4489 Other specified chronic obstructive pulmonary disease: Secondary | ICD-10-CM | POA: Diagnosis present

## 2014-02-15 DIAGNOSIS — G40909 Epilepsy, unspecified, not intractable, without status epilepticus: Secondary | ICD-10-CM | POA: Diagnosis not present

## 2014-02-15 DIAGNOSIS — J811 Chronic pulmonary edema: Secondary | ICD-10-CM | POA: Diagnosis present

## 2014-02-15 DIAGNOSIS — I10 Essential (primary) hypertension: Secondary | ICD-10-CM | POA: Diagnosis not present

## 2014-02-15 DIAGNOSIS — Z992 Dependence on renal dialysis: Secondary | ICD-10-CM | POA: Diagnosis not present

## 2014-02-15 DIAGNOSIS — Z87891 Personal history of nicotine dependence: Secondary | ICD-10-CM | POA: Diagnosis not present

## 2014-02-15 DIAGNOSIS — R0602 Shortness of breath: Secondary | ICD-10-CM | POA: Diagnosis present

## 2014-02-15 DIAGNOSIS — I69998 Other sequelae following unspecified cerebrovascular disease: Secondary | ICD-10-CM | POA: Diagnosis not present

## 2014-02-15 DIAGNOSIS — I120 Hypertensive chronic kidney disease with stage 5 chronic kidney disease or end stage renal disease: Secondary | ICD-10-CM | POA: Diagnosis not present

## 2014-02-15 DIAGNOSIS — I509 Heart failure, unspecified: Secondary | ICD-10-CM | POA: Diagnosis not present

## 2014-02-15 DIAGNOSIS — D631 Anemia in chronic kidney disease: Secondary | ICD-10-CM | POA: Diagnosis present

## 2014-02-15 DIAGNOSIS — J810 Acute pulmonary edema: Secondary | ICD-10-CM | POA: Diagnosis not present

## 2014-02-15 DIAGNOSIS — IMO0002 Reserved for concepts with insufficient information to code with codable children: Secondary | ICD-10-CM

## 2014-02-15 DIAGNOSIS — J449 Chronic obstructive pulmonary disease, unspecified: Secondary | ICD-10-CM

## 2014-02-15 DIAGNOSIS — R0600 Dyspnea, unspecified: Secondary | ICD-10-CM

## 2014-02-15 DIAGNOSIS — R509 Fever, unspecified: Secondary | ICD-10-CM

## 2014-02-15 DIAGNOSIS — D696 Thrombocytopenia, unspecified: Secondary | ICD-10-CM | POA: Diagnosis not present

## 2014-02-15 DIAGNOSIS — N186 End stage renal disease: Secondary | ICD-10-CM | POA: Diagnosis not present

## 2014-02-15 DIAGNOSIS — J9600 Acute respiratory failure, unspecified whether with hypoxia or hypercapnia: Secondary | ICD-10-CM | POA: Diagnosis not present

## 2014-02-15 DIAGNOSIS — R0609 Other forms of dyspnea: Secondary | ICD-10-CM

## 2014-02-15 DIAGNOSIS — R0603 Acute respiratory distress: Secondary | ICD-10-CM

## 2014-02-15 DIAGNOSIS — N039 Chronic nephritic syndrome with unspecified morphologic changes: Secondary | ICD-10-CM | POA: Diagnosis not present

## 2014-02-15 DIAGNOSIS — J9601 Acute respiratory failure with hypoxia: Secondary | ICD-10-CM

## 2014-02-15 HISTORY — DX: Cerebral infarction, unspecified: I63.9

## 2014-02-15 HISTORY — DX: Chronic obstructive pulmonary disease, unspecified: J44.9

## 2014-02-15 HISTORY — DX: Gout, unspecified: M10.9

## 2014-02-15 HISTORY — DX: Anemia in chronic kidney disease: D63.1

## 2014-02-15 HISTORY — DX: Dependence on renal dialysis: Z99.2

## 2014-02-15 HISTORY — DX: End stage renal disease: N18.6

## 2014-02-15 HISTORY — DX: Chronic kidney disease, unspecified: N18.9

## 2014-02-15 LAB — BASIC METABOLIC PANEL
BUN: 72 mg/dL — ABNORMAL HIGH (ref 6–23)
CO2: 20 mEq/L (ref 19–32)
Calcium: 9.3 mg/dL (ref 8.4–10.5)
Chloride: 100 mEq/L (ref 96–112)
Creatinine, Ser: 12.2 mg/dL — ABNORMAL HIGH (ref 0.50–1.35)
GFR calc Af Amer: 4 mL/min — ABNORMAL LOW (ref >90)
GFR calc non Af Amer: 4 mL/min — ABNORMAL LOW (ref >90)
Glucose, Bld: 95 mg/dL (ref 70–99)
Potassium: 4.4 mEq/L (ref 3.7–5.3)
Sodium: 142 mEq/L (ref 137–147)

## 2014-02-15 LAB — BLOOD GAS, ARTERIAL
Acid-base deficit: 3.7 mmol/L — ABNORMAL HIGH (ref 0.0–2.0)
Bicarbonate: 19.9 mEq/L — ABNORMAL LOW (ref 20.0–24.0)
Drawn by: 234301
O2 Content: 2 L/min
O2 Saturation: 96.7 %
Patient temperature: 37
TCO2: 18.2 mmol/L (ref 0–100)
pCO2 arterial: 30.5 mmHg — ABNORMAL LOW (ref 35.0–45.0)
pH, Arterial: 7.431 (ref 7.350–7.450)
pO2, Arterial: 95.7 mmHg (ref 80.0–100.0)

## 2014-02-15 LAB — CBC WITH DIFFERENTIAL/PLATELET
Basophils Absolute: 0 10*3/uL (ref 0.0–0.1)
Basophils Relative: 0 % (ref 0–1)
Eosinophils Absolute: 0 10*3/uL (ref 0.0–0.7)
Eosinophils Relative: 0 % (ref 0–5)
HCT: 32.7 % — ABNORMAL LOW (ref 39.0–52.0)
Hemoglobin: 10.9 g/dL — ABNORMAL LOW (ref 13.0–17.0)
Lymphocytes Relative: 9 % — ABNORMAL LOW (ref 12–46)
Lymphs Abs: 0.8 10*3/uL (ref 0.7–4.0)
MCH: 29.6 pg (ref 26.0–34.0)
MCHC: 33.3 g/dL (ref 30.0–36.0)
MCV: 88.9 fL (ref 78.0–100.0)
Monocytes Absolute: 0.6 10*3/uL (ref 0.1–1.0)
Monocytes Relative: 7 % (ref 3–12)
Neutro Abs: 7 10*3/uL (ref 1.7–7.7)
Neutrophils Relative %: 84 % — ABNORMAL HIGH (ref 43–77)
Platelets: 126 10*3/uL — ABNORMAL LOW (ref 150–400)
RBC: 3.68 MIL/uL — ABNORMAL LOW (ref 4.22–5.81)
RDW: 17.1 % — ABNORMAL HIGH (ref 11.5–15.5)
WBC: 8.4 10*3/uL (ref 4.0–10.5)

## 2014-02-15 LAB — TROPONIN I
Troponin I: <0.3 ng/mL (ref <0.30)
Troponin I: 0.38 ng/mL (ref <0.30)
Troponin I: 0.73 ng/mL (ref <0.30)

## 2014-02-15 LAB — PHOSPHORUS: PHOSPHORUS: 4 mg/dL (ref 2.3–4.6)

## 2014-02-15 LAB — PRO B NATRIURETIC PEPTIDE: Pro B Natriuretic peptide (BNP): >70000 pg/mL — ABNORMAL HIGH (ref 0.0–100.0)

## 2014-02-15 LAB — MRSA PCR SCREENING: MRSA by PCR: NEGATIVE

## 2014-02-15 MED ORDER — SODIUM CHLORIDE 0.9 % IV SOLN: 100.0000 mL | INTRAVENOUS | Status: DC | PRN

## 2014-02-15 MED ORDER — MORPHINE SULFATE 2 MG/ML IJ SOLN: 2.0000 mg | INTRAMUSCULAR | Status: DC | PRN

## 2014-02-15 MED ORDER — LIDOCAINE-PRILOCAINE 2.5-2.5 % EX CREA
1.0000 "application " | TOPICAL_CREAM | CUTANEOUS | Status: DC | PRN
  Filled 2014-02-15: qty 5

## 2014-02-15 MED ORDER — RENA-VITE PO TABS
1.0000 | ORAL_TABLET | Freq: Every day | ORAL | Status: DC
  Administered 2014-02-15 – 2014-02-16 (×2): 1 via ORAL
  Filled 2014-02-15 (×2): qty 1

## 2014-02-15 MED ORDER — NEPRO/CARBSTEADY PO LIQD
237.0000 mL | ORAL | Status: DC | PRN
  Filled 2014-02-15: qty 237

## 2014-02-15 MED ORDER — PHENYTOIN SODIUM EXTENDED 100 MG PO CAPS
300.0000 mg | ORAL_CAPSULE | Freq: Every day | ORAL | Status: DC
  Administered 2014-02-15 – 2014-02-16 (×2): 300 mg via ORAL
  Filled 2014-02-15 (×2): qty 3

## 2014-02-15 MED ORDER — SODIUM CHLORIDE 0.9 % IV SOLN
INTRAVENOUS | Status: DC
  Administered 2014-02-15: 10 mL/h via INTRAVENOUS

## 2014-02-15 MED ORDER — PHENYTOIN SODIUM EXTENDED 100 MG PO CAPS: 200.0000 mg | ORAL_CAPSULE | Freq: Two times a day (BID) | ORAL | Status: DC

## 2014-02-15 MED ORDER — ONDANSETRON HCL 4 MG PO TABS
4.0000 mg | ORAL_TABLET | Freq: Three times a day (TID) | ORAL | Status: DC
  Administered 2014-02-15 – 2014-02-17 (×6): 4 mg via ORAL
  Filled 2014-02-15 (×6): qty 1

## 2014-02-15 MED ORDER — LISINOPRIL 10 MG PO TABS
20.0000 mg | ORAL_TABLET | Freq: Once | ORAL | Status: AC
  Administered 2014-02-15: 20 mg via ORAL
  Filled 2014-02-15: qty 2

## 2014-02-15 MED ORDER — SEVELAMER CARBONATE 800 MG PO TABS: 800.0000 mg | ORAL_TABLET | Freq: Three times a day (TID) | ORAL | Status: DC

## 2014-02-15 MED ORDER — DOCUSATE SODIUM 100 MG PO CAPS
100.0000 mg | ORAL_CAPSULE | Freq: Two times a day (BID) | ORAL | Status: DC
  Administered 2014-02-15 – 2014-02-16 (×3): 100 mg via ORAL
  Filled 2014-02-15 (×4): qty 1

## 2014-02-15 MED ORDER — SEVELAMER CARBONATE 800 MG PO TABS: 800.0000 mg | ORAL_TABLET | ORAL | Status: DC | PRN

## 2014-02-15 MED ORDER — PENTAFLUOROPROP-TETRAFLUOROETH EX AERO
1.0000 "application " | INHALATION_SPRAY | CUTANEOUS | Status: DC | PRN
  Filled 2014-02-15: qty 30

## 2014-02-15 MED ORDER — CARVEDILOL 3.125 MG PO TABS
6.2500 mg | ORAL_TABLET | Freq: Once | ORAL | Status: DC
  Filled 2014-02-15: qty 2

## 2014-02-15 MED ORDER — HYDRALAZINE HCL 20 MG/ML IJ SOLN: 5.0000 mg | INTRAMUSCULAR | Status: DC | PRN

## 2014-02-15 MED ORDER — NIFEDIPINE ER OSMOTIC RELEASE 30 MG PO TB24
90.0000 mg | ORAL_TABLET | Freq: Every day | ORAL | Status: DC
  Administered 2014-02-16 – 2014-02-17 (×2): 90 mg via ORAL
  Filled 2014-02-15 (×2): qty 3

## 2014-02-15 MED ORDER — LIDOCAINE HCL (PF) 1 % IJ SOLN: 5.0000 mL | INTRAMUSCULAR | Status: DC | PRN

## 2014-02-15 MED ORDER — LISINOPRIL 10 MG PO TABS
20.0000 mg | ORAL_TABLET | Freq: Every day | ORAL | Status: DC
  Administered 2014-02-16 – 2014-02-17 (×2): 20 mg via ORAL
  Filled 2014-02-15 (×2): qty 2

## 2014-02-15 MED ORDER — IPRATROPIUM BROMIDE HFA 17 MCG/ACT IN AERS
1.0000 | INHALATION_SPRAY | Freq: Four times a day (QID) | RESPIRATORY_TRACT | Status: DC
Start: 2014-02-15 — End: 2014-02-15

## 2014-02-15 MED ORDER — CARVEDILOL 3.125 MG PO TABS
6.2500 mg | ORAL_TABLET | Freq: Two times a day (BID) | ORAL | Status: DC
  Administered 2014-02-15 – 2014-02-16 (×4): 6.25 mg via ORAL
  Filled 2014-02-15 (×4): qty 2

## 2014-02-15 MED ORDER — ONDANSETRON HCL 4 MG/2ML IJ SOLN: 4.0000 mg | Freq: Four times a day (QID) | INTRAMUSCULAR | Status: DC | PRN

## 2014-02-15 MED ORDER — PHENYTOIN SODIUM EXTENDED 100 MG PO CAPS
200.0000 mg | ORAL_CAPSULE | Freq: Every day | ORAL | Status: DC
  Administered 2014-02-15 – 2014-02-17 (×3): 200 mg via ORAL
  Filled 2014-02-15 (×3): qty 2

## 2014-02-15 MED ORDER — ACETAMINOPHEN 325 MG PO TABS: 650.0000 mg | ORAL_TABLET | Freq: Four times a day (QID) | ORAL | Status: DC | PRN

## 2014-02-15 MED ORDER — HEPARIN SODIUM (PORCINE) 1000 UNIT/ML DIALYSIS
1000.0000 [IU] | INTRAMUSCULAR | Status: DC | PRN
  Filled 2014-02-15: qty 1

## 2014-02-15 MED ORDER — ACETAMINOPHEN 650 MG RE SUPP: 650.0000 mg | Freq: Four times a day (QID) | RECTAL | Status: DC | PRN

## 2014-02-15 MED ORDER — NIFEDIPINE ER OSMOTIC RELEASE 30 MG PO TB24
90.0000 mg | ORAL_TABLET | Freq: Once | ORAL | Status: AC
  Administered 2014-02-15: 90 mg via ORAL
  Filled 2014-02-15: qty 3

## 2014-02-15 MED ORDER — ALBUTEROL SULFATE (2.5 MG/3ML) 0.083% IN NEBU: 2.5000 mg | INHALATION_SOLUTION | RESPIRATORY_TRACT | Status: DC | PRN

## 2014-02-15 MED ORDER — ALTEPLASE 2 MG IJ SOLR: 2.0000 mg | Freq: Once | INTRAMUSCULAR | Status: AC | PRN

## 2014-02-15 MED ORDER — SEVELAMER CARBONATE 800 MG PO TABS
1600.0000 mg | ORAL_TABLET | Freq: Three times a day (TID) | ORAL | Status: DC
  Administered 2014-02-15 – 2014-02-17 (×6): 1600 mg via ORAL
  Filled 2014-02-15 (×6): qty 2

## 2014-02-15 MED ORDER — PREDNISONE 10 MG PO TABS
5.0000 mg | ORAL_TABLET | Freq: Every day | ORAL | Status: DC
  Administered 2014-02-16 – 2014-02-17 (×2): 5 mg via ORAL
  Filled 2014-02-15 (×2): qty 1

## 2014-02-15 MED ORDER — ONDANSETRON HCL 4 MG PO TABS
4.0000 mg | ORAL_TABLET | Freq: Four times a day (QID) | ORAL | Status: DC | PRN
  Administered 2014-02-16 (×2): 4 mg via ORAL

## 2014-02-15 MED ORDER — ALLOPURINOL 100 MG PO TABS
100.0000 mg | ORAL_TABLET | Freq: Every day | ORAL | Status: DC
  Administered 2014-02-16 – 2014-02-17 (×2): 100 mg via ORAL
  Filled 2014-02-15 (×2): qty 1

## 2014-02-15 MED ORDER — GUAIFENESIN-DM 100-10 MG/5ML PO SYRP: 5.0000 mL | ORAL_SOLUTION | ORAL | Status: DC | PRN

## 2014-02-15 MED ORDER — VANCOMYCIN HCL IN DEXTROSE 1-5 GM/200ML-% IV SOLN
1000.0000 mg | Freq: Once | INTRAVENOUS | Status: AC
  Administered 2014-02-15: 1000 mg via INTRAVENOUS
  Filled 2014-02-15: qty 200

## 2014-02-15 NOTE — Significant Event (Signed)
Daughter took patients clothing and wallet home after I had called security to lock up patients wallet. Patient is aware that daughter has belongings.

## 2014-02-15 NOTE — ED Notes (Signed)
Pt breathing easier on bipap, appears more comfortable, RR decreased, able to rest.  nad noted.

## 2014-02-15 NOTE — ED Provider Notes (Signed)
CSN: [id number]     Arrival date & time 02/15/14  0740 History   First MD Initiated Contact with Patient 02/15/14 0745     Chief Complaint  Patient presents with  . Shortness of Breath     (Consider location/radiation/quality/duration/timing/severity/associated sxs/prior Treatment) HPI   67yM with dyspnea.  HX ESRD on dialysis, last treatment was Saturday. Visiting from [place] but has been getting dialysis as scheduled TTS and not missed any sessions. Denies chest pain. 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 Admitted to hospital about a month ago for similar reasons. Did not take morning meds yet today. No fever or chills. Occasional nonproductive cough. No n/v. LE edemawhich he denies is acutely changed. RLE>L. Pt reports chronic. Hx of CVA with residual L sided weakness.   Past Medical History  Diagnosis Date  . Kidney disease, chronic, end stage on dialysis     sat, tues, thurs  . Hypertension   . Seizure disorder    History reviewed. No pertinent past surgical history. No family history on file. History  Substance Use Topics  . Smoking status: Former Games developer  . Smokeless tobacco: Not on file  . Alcohol Use: No    Review of Systems  All systems reviewed and negative, other than as noted in HPI.   Allergies  Review of patient's allergies indicates no known allergies.  Home Medications   Prior to Admission medications   Medication Sig Start Date End Date Taking? Authorizing Provider  acetaminophen (TYLENOL) 325 MG tablet Take 650 mg by mouth every 6 (six) hours as needed.    Historical Provider, MD  allopurinol (ZYLOPRIM) 100 MG tablet Take 100 mg by mouth daily.    Historical Provider, MD  b complex-vitamin c-folic acid (NEPHRO-VITE) 0.8 MG TABS tablet Take 1 tablet by mouth daily.    Historical Provider, MD  carvedilol (COREG) 6.25 MG tablet Take 6.25 mg by mouth 2 (two) times daily.    Historical Provider, MD  ipratropium (ATROVENT HFA) 17  MCG/ACT inhaler Inhale 1 puff into the lungs every 6 (six) hours.    Historical Provider, MD  lisinopril (PRINIVIL,ZESTRIL) 20 MG tablet Take 20 mg by mouth daily.    Historical Provider, MD  NIFEdipine (PROCARDIA XL/ADALAT-CC) 90 MG 24 hr tablet Take 90 mg by mouth daily.    Historical Provider, MD  ondansetron (ZOFRAN) 4 MG tablet Take 4 mg by mouth 3 (three) times daily.    Historical Provider, MD  phenytoin (DILANTIN) 100 MG ER capsule Take 200-300 mg by mouth 2 (two) times daily. Two in the morning and three at bedtime    Historical Provider, MD  predniSONE (DELTASONE) 5 MG tablet Take 5 mg by mouth daily with breakfast.    Historical Provider, MD  sevelamer carbonate (RENVELA) 800 MG tablet Take 800-1,600 mg by mouth 3 (three) times daily with meals. 2 with meals and one with snacks    Historical Provider, MD   BP 160/102  Pulse 98  Temp(Src) 98.1 F (36.7 C) (Oral)  Resp 28  Ht  (1.727 m)  Wt 130 lb (58.968 kg)  BMI 19.77 kg/m2  SpO2 95% Physical Exam  Nursing note and vitals reviewed. Constitutional: He appears well-developed and well-nourished.  HENT:  Head: Normocephalic and atraumatic.  Eyes: Conjunctivae are normal. Right eye exhibits no discharge. Left eye exhibits no discharge.  Neck: Neck supple.  Cardiovascular: Regular [id number] normal heart sounds.  Exam reveals no gallop and no friction rub.  No murmur heard. Mild tachycardia. Dialysis cath R chest  Pulmonary/Chest: He is in respiratory distress.  Tachpnea. B/l crackles.   Abdominal: Soft. He exhibits no distension. There is no tenderness.  Musculoskeletal: He exhibits edema. He exhibits no tenderness.  Pitting LE edema.   Neurological: He is alert.  Skin: Skin is warm and dry.  Psychiatric: He has a normal mood and affect. His behavior is normal. Thought content normal.    ED Course  Procedures (including critical care time) Labs Review Labs Reviewed  CBC WITH DIFFERENTIAL - Abnormal; Notable for  the following:    RBC 3.68 (*)    Hemoglobin 10.9 (*)    HCT 32.7 (*)    RDW 17.1 (*)    Platelets 126 (*)    Neutrophils Relative % 84 (*)    Lymphocytes Relative 9 (*)    All other components within normal limits  BASIC METABOLIC PANEL  TROPONIN I    Imaging Review Dg Chest 2 View  02/15/2014   CLINICAL DATA:  Difficulty breathing  EXAM: CHEST  2 VIEW  COMPARISON:  January 19, 2014  FINDINGS: There is cardiomegaly with a minimal right effusion and mild residual edema consistent with a degree of residual congestive heart failure. There is no airspace consolidation. There is pulmonary venous hypertension. No adenopathy. There are surgical clips in the right axillary region.  IMPRESSION: A degree of congestive heart failure remains. No airspace consolidation.   Electronically Signed   By: Bretta Bang M.D.   On: 02/15/2014 08:38     EKG Interpretation   Date/Time:  Tuesday February 15 2014 07:51:40 EDT Ventricular Rate:  101 PR Interval:  125 QRS Duration: 84 QT Interval:  362 QTC Calculation: 469 R Axis:   33 Text Interpretation:  Sinus tachycardia Left ventricular hypertrophy  Nonspecific T abnormalities, diffuse leads Confirmed by Juleen China  MD, Robby Pirani  ([id number]) on 02/15/2014 9:40:18 AM      MDM   Final diagnoses:  Dyspnea  ESRD (end stage renal disease)    67yM with dyspnea. Sent from dialysis center to ED prior to getting dialysis. Clinically, this is what he needs. Unfortunately it has been delayed and now work of breathing has further declined to point I don't think he is appropriate for discharge. Increasing tachypnea. HR increasing. Needsing supplemental o2 to keep to sat above 90%. Will start bipap. Essentially anuric. Lasix deferred. Will discuss with nephrology. Will discuss with medicine.     Raeford Razor, MD 02/15/14 [phone]

## 2014-02-15 NOTE — Progress Notes (Signed)
ANTIBIOTIC CONSULT NOTE - INITIAL  Pharmacy Consult for Vancomycin Indication: Fever, HD patient  No Known Allergies  Patient Measurements: Height:  (172.7 cm) Weight: 135 lb (61.236 kg) IBW/kg (Calculated) : 68.4 Adjusted Body Weight:  Vital Signs: Temp: 101.1 F (38.4 C) (08/25 1216) Temp src: Axillary (08/25 1216) BP: 151/85 mmHg (08/25 1500) Pulse Rate: 94 (08/25 1521) Intake/Output from previous day:   Intake/Output from this shift:    Labs:  Recent Labs  02/15/14 0805 02/15/14 0905  WBC 8.4  --   HGB 10.9*  --   PLT 126*  --   CREATININE  --  12.20*   Estimated Creatinine Clearance: 5.2 ml/min (by C-G formula based on Cr of 12.2). No results found for this basename: VANCOTROUGH, Leodis Binet, VANCORANDOM, GENTTROUGH, GENTPEAK, GENTRANDOM, TOBRATROUGH, TOBRAPEAK, TOBRARND, AMIKACINPEAK, AMIKACINTROU, AMIKACIN,  in the last 72 hours   Microbiology: Recent Results (from the past 720 hour(s))  MRSA PCR SCREENING     Status: None   Collection Time    01/17/14 11:40 PM      Result Value Ref Range Status   MRSA by PCR NEGATIVE  NEGATIVE Final   Comment:            The GeneXpert MRSA Assay (FDA     approved for NASAL specimens     only), is one component of a     comprehensive MRSA colonization     surveillance program. It is not     intended to diagnose MRSA     infection nor to guide or     monitor treatment for     MRSA infections.  CULTURE, BLOOD (ROUTINE X 2)     Status: None   Collection Time    01/18/14  7:26 AM      Result Value Ref Range Status   Specimen Description BLOOD RIGHT HAND   Final   Special Requests BOTTLES DRAWN AEROBIC AND ANAEROBIC 7CC   Final   Culture NO GROWTH 5 DAYS   Final   Report Status 01/23/2014 FINAL   Final  CULTURE, BLOOD (ROUTINE X 2)     Status: None   Collection Time    01/18/14  7:26 AM      Result Value Ref Range Status   Specimen Description BLOOD RIGHT ARM   Final   Special Requests BOTTLES DRAWN AEROBIC AND  ANAEROBIC 6CC   Final   Culture NO GROWTH 5 DAYS   Final   Report Status 01/23/2014 FINAL   Final    Medical History: Past Medical History  Diagnosis Date  . ESRD (end stage renal disease) on dialysis     sat, tues, thurs  . Hypertension   . Seizure disorder   . Anemia in chronic kidney disease 01/18/2014  . COPD (chronic obstructive pulmonary disease)   . Gout   . CVA (cerebral infarction)     Mild left-sided weakness, residual    Medications:  Scheduled:  . [START ON 02/16/2014] allopurinol  100 mg Oral Daily  . carvedilol  6.25 mg Oral BID  . docusate sodium  100 mg Oral BID  . [START ON 02/16/2014] lisinopril  20 mg Oral Daily  . multivitamin  1 tablet Oral QHS  . [START ON 02/16/2014] NIFEdipine  90 mg Oral Daily  . ondansetron  4 mg Oral TID  . phenytoin  200 mg Oral Daily   And  . phenytoin  300 mg Oral QHS  . [START ON 02/16/2014] predniSONE  5 mg  Oral Q breakfast  . sevelamer carbonate  1,600 mg Oral TID WC  . vancomycin  1,000 mg Intravenous Once   Assessment: Dialysis patient, fever Dialysis scheduled for today  Goal of Therapy:  Vancomycin trough level 15-20 mcg/ml  Plan:  Vancomycin 1 GM IV after dialysis today. F/U dialysis days for additional dosing Normal dialysis Tues/Thurs/Sat Labs per protocol  Timothy Rodgers, Trelon Plush Bennett 02/15/2014,3:43 PM

## 2014-02-15 NOTE — Discharge Instructions (Signed)
Dialysis  Dialysis is a procedure that replaces some of the work healthy kidneys do. It is done when you lose about 85-90% of your kidney function. It may also be done earlier if your symptoms may be improved by dialysis. During dialysis, wastes, salt, and extra water are removed from the blood, and the levels of certain chemicals in the blood (such as potassium) are maintained. Dialysis is done in sessions. Dialysis sessions are continued until the kidneys get better. If the kidneys cannot get better, such as in end-stage kidney disease, dialysis is continued for life or until you receive a new kidney (kidney transplant). There are two types of dialysis: hemodialysis and peritoneal dialysis.  WHAT IS HEMODIALYSIS?   Hemodialysis is a type of dialysis in which a machine called a dialyzer is used to filter the blood. Before beginning hemodialysis, you will have surgery to create a site where blood can be removed from the body and returned to the body (vascular access). There are three types of vascular accesses:  · Arteriovenous fistula. To create this type of access, an artery is connected to a vein (usually in the arm). A fistula takes 1-6 months to develop after surgery. If it develops properly, it usually lasts longer than the other types of vascular accesses. It is also less likely to become infected and cause blood clots.  · Arteriovenous graft. To create this type of access, an artery and a vein in the arm are connected with a tube. A graft may be used within 2-3 weeks of surgery.  · A venous catheter. To create this type of access, a thin, flexible tube (catheter) is placed in a large vein in your neck, chest, or groin. A catheter may be used right away. It is usually used as a temporary access when dialysis needs to begin immediately.  During hemodialysis, blood leaves the body through your access. It travels through a tube to the dialyzer, where it is filtered. The blood then returns to your body through  another tube.  Hemodialysis is usually performed by a health care provider at a hospital or dialysis center three times a week. Visits last about 3-4 hours. It may also be performed with the help of another person at home with training.   WHAT IS PERITONEAL DIALYSIS?  Peritoneal dialysis is a type of dialysis in which the thin lining of the abdomen (peritoneum) is used as a filter. Before beginning peritoneal dialysis, you will have surgery to place a catheter in your abdomen. The catheter will be used to transfer a fluid called dialysate to and from your abdomen. At the start of a session, your abdomen is filled with dialysate. During the session, wastes, salt, and extra water in the blood pass through the peritoneum and into the dialysate. The dialysate is drained from the body at the end of the session. The process of filling and draining the dialysate is called an exchange. Exchanges are repeated until you have used up all the dialysate for the day.  Peritoneal dialysis may be performed by you at home or at almost any other location. It is done every day. You may need up to five exchanges a day. The amount of time the dialysate is in your body between exchanges is called a dwell. The dwell depends on the number of exchanges needed and the characteristics of the peritoneum. It usually varies from 1.5-3 hours. You may go about your day normally between exchanges. Alternately, the exchanges may be done at night   while you sleep, using a machine called a cycler.  WHICH TYPE OF DIALYSIS SHOULD I CHOOSE?   Both hemodialysis and peritoneal dialysis have advantages and disadvantages. Talk to your health care provider about which type of dialysis would be best for you. Your lifestyle and preferences should be considered along with your medical condition. In some cases, only one type of dialysis may be an option.   Advantages of hemodialysis  · It is done less often than peritoneal dialysis.  · Someone else can do the  dialysis for you.  · If you go to a dialysis center, your health care provider will be able to recognize any problems right away.  · If you go to a dialysis center, you can interact with others who are having dialysis. This can provide you with emotional support.  Disadvantages of hemodialysis  · Hemodialysis may cause cramps and low blood pressure. It may leave you feeling tired on the days you have the treatment.  · If you go to a dialysis center, you will need to make weekly appointments and work around the center's schedule.  · You will need to take extra care when traveling. If you go to a dialysis center, you will need to make special arrangements to visit a dialysis center near your destination. If you are having treatments at home, you will need to take the dialyzer with you to your destination.  · You will need to avoid more foods than you would need to avoid on peritoneal dialysis.  Advantages of peritoneal dialysis  · It is less likely than hemodialysis to cause cramps and low blood pressure.  · You may do exchanges on your own wherever you are, including when you travel.  · You do not need to avoid as many foods as you do on hemodialysis.  Disadvantages of peritoneal dialysis  · It is done more often than hemodialysis.  · Performing peritoneal dialysis requires you to have dexterity of the hands. You must also be able to lift bags.  · You will have to learn sterilization techniques. You will need to practice them every day to reduce the risk of infection.   WHAT CHANGES WILL I NEED TO MAKE TO MY DIET DURING DIALYSIS?  Both hemodialysis and peritoneal dialysis require you to make some changes to your diet. For example, you will need to limit your intake of foods high in the minerals phosphorus and potassium. You will also need to limit your fluid intake. Your dietitian can help you plan meals. A good meal plan can improve your dialysis and your health.   WHAT SHOULD I EXPECT WHEN BEGINNING  DIALYSIS?  Adjusting to the dialysis treatment, schedule, and diet can take some time. You may need to stop working and may not be able to do some of the things you normally do. You may feel anxious or depressed when beginning dialysis. Eventually, many people feel better overall because of dialysis. Some people are able to return to work after making some changes, such as reducing work intensity.  WHERE CAN I FIND MORE INFORMATION?   · National Kidney Foundation: www.kidney.org  · American Association of Kidney Patients: www.aakp.org  · American Kidney Fund: www.kidneyfund.org  Document Released: 08/31/2002 Document Revised: 10/25/2013 Document Reviewed: 08/04/2012  ExitCare® Patient Information ©2015 [employer]. This information is not intended to replace advice given to you by your health care provider. Make sure you discuss any questions you have with your health care provider.

## 2014-02-15 NOTE — H&P (Signed)
[facility] History and Physical  Timothy Rodgers KGM:[id number] DOB: 03-11-1946 DOA: 02/15/2014  Referring physician:  PCP: No primary provider on file. (in [place]).  Chief Complaint: Shortness of breath.  HPI: Timothy Rodgers is a 68 y.o. male with a history of end-stage renal disease on hemodialysis, hypertension, COPD, and gout, who presents to the hospital today with a complaint of shortness of breath. He was recently hospitalized from 01/17/2014 through 01/19/2014 for pulmonary edema secondary to end-stage renal disease. This morning, apparently, he presented to the dialysis center this morning. He complained of shortness of breath. He also complained of mild swelling in his legs. The staff at the dialysis center advised the patient to come to the emergency department for further evaluation. He was placed on BiPAP and remained on BiPAP during the history and physical. Currently, he has no complaints of chest pain and shortness of breath on BiPAP. He denies cough, fever, chills, pleurisy, nausea, vomiting, diarrhea, or abdominal swelling. He is still visiting from [place] and plans to fly back to [place] next week.  In the ED, he was afebrile mildly tachycardic and hypertensive with a blood pressure of 196/100. He was oxygenating 100% on BiPAP/supplemental oxygen. His EKG reveals sinus tachycardia with nonspecific T-wave and a heart rate of 101 beats per minute. His chest x-ray revealed a degree of congestive heart failure, but no airspace consolidation. His ABG revealed a pH of 7.4, PCO2 of 31, and PO2 of 96. His lab data were significant for a pro BNP of greater than 70,000, normal troponin I., BUN of 72, creatinine of 12.2, and normal potassium of 4.4. Platelet count 126 and hemoglobin 10.9. He was admitted for further evaluation and management.    Review of Systems:  As above in history present illness. In addition, he has occasional shortness of breath at baseline, mild  left-sided weakness from previous stroke, occasional nonproductive cough, otherwise negative.  Past Medical History  Diagnosis Date  . ESRD (end stage renal disease) on dialysis     sat, tues, thurs  . Hypertension   . Seizure disorder   . Anemia in chronic kidney disease 01/18/2014  . COPD (chronic obstructive pulmonary disease)    Past surgical history: AV fistula Social History: The patient is married and lives in [place], but he is currently visiting his son Timothy Rodgers. His daughter Timothy Rodgers and his niece, Timothy Rodgers are present. He is retired. He stopped smoking years ago.  No Known Allergies Family history: His parents are deceased.  Prior to Admission medications   Medication Sig Start Date End Date Taking? Authorizing Provider  allopurinol (ZYLOPRIM) 100 MG tablet Take 100 mg by mouth daily.   Yes Historical Provider, MD  b complex-vitamin c-folic acid (NEPHRO-VITE) 0.8 MG TABS tablet Take 1 tablet by mouth daily.   Yes Historical Provider, MD  carvedilol (COREG) 6.25 MG tablet Take 6.25 mg by mouth 2 (two) times daily.   Yes Historical Provider, MD  ipratropium (ATROVENT HFA) 17 MCG/ACT inhaler Inhale 1 puff into the lungs every 6 (six) hours.   Yes Historical Provider, MD  lisinopril (PRINIVIL,ZESTRIL) 20 MG tablet Take 20 mg by mouth daily.   Yes Historical Provider, MD  NIFEdipine (PROCARDIA XL/ADALAT-CC) 90 MG 24 hr tablet Take 90 mg by mouth daily.   Yes Historical Provider, MD  ondansetron (ZOFRAN) 4 MG tablet Take 4 mg by mouth 3 (three) times daily.   Yes Historical Provider, MD  phenytoin (DILANTIN) 100 MG ER capsule Take 200-300 mg by mouth  2 (two) times daily. Two in the morning and three at bedtime   Yes Historical Provider, MD  predniSONE (DELTASONE) 5 MG tablet Take 5 mg by mouth daily with breakfast.   Yes Historical Provider, MD  sevelamer carbonate (RENVELA) 800 MG tablet Take 800-1,600 mg by mouth 3 (three) times daily with meals. 2 with meals and one with snacks   Yes  Historical Provider, MD  acetaminophen (TYLENOL) 325 MG tablet Take 650 mg by mouth every 6 (six) hours as needed for mild pain.     Historical Provider, MD   Physical Exam: Filed Vitals:   02/15/14 1000 02/15/14 1021 02/15/14 1100 02/15/14 1130  BP: 173/107  165/107 165/98  Pulse:   100 100  Temp:      TempSrc:      Resp: 39  28 21  Height:   (1.727 m)    Weight:      SpO2:   100% 100%    Wt Readings from Last 3 Encounters:  02/15/14 58.968 kg (130 lb)  01/19/14 57 kg (125 lb 10.6 oz)    General: Alert 68 year old African-American man with BiPAP on, but does not appear to be any distress currently. Eyes: PERRL, normal lids, irises & conjunctiva ENT: Oropharynx difficult to examine with BiPAP on, but mucous membranes appear to be mildly dry; no abdomen allergies of his lips or his oropharyngeal mucosa otherwise. Neck: no LAD, masses or thyromegaly; mild JVD bilaterally. Cardiovascular: Distant S1, S2, with borderline tachycardia and a soft systolic murmur. Telemetry: SR, no arrhythmias  Respiratory: Bilateral crackles, right greater than left. Breathing nonlabored on BiPAP. Abdomen: Positive bowel sounds, soft, nontender and nondistended. Skin: no rash or induration seen on limited exam Musculoskeletal: grossly normal tone BUE/BLE; no acute hot red joints. Trace to 1+ bilateral lower extremity edema. Psychiatric: grossly normal mood and affect, speech fluent and appropriate Neurologic: grossly non-focal.cranial nerves II through XII are grossly intact.           Labs on Admission:  Basic Metabolic Panel:  Recent Labs Lab 02/15/14 0905  NA 142  K 4.4  CL 100  CO2 20  GLUCOSE 95  BUN 72*  CREATININE 12.20*  CALCIUM 9.3   Liver Function Tests: No results found for this basename: AST, ALT, ALKPHOS, BILITOT, PROT, ALBUMIN,  in the last 168 hours No results found for this basename: LIPASE, AMYLASE,  in the last 168 hours No results found for this basename:  AMMONIA,  in the last 168 hours CBC:  Recent Labs Lab 02/15/14 0805  WBC 8.4  NEUTROABS 7.0  HGB 10.9*  HCT 32.7*  MCV 88.9  PLT 126*   Cardiac Enzymes:  Recent Labs Lab 02/15/14 0905  TROPONINI <0.30    BNP (last 3 results)  Recent Labs  01/17/14 2115 02/15/14 0805  PROBNP 54522.0* >70000.0*   CBG: No results found for this basename: GLUCAP,  in the last 168 hours  Radiological Exams on Admission: Dg Chest 2 View  02/15/2014   CLINICAL DATA:  Difficulty breathing  EXAM: CHEST  2 VIEW  COMPARISON:  January 19, 2014  FINDINGS: There is cardiomegaly with a minimal right effusion and mild residual edema consistent with a degree of residual congestive heart failure. There is no airspace consolidation. There is pulmonary venous hypertension. No adenopathy. There are surgical clips in the right axillary region.  IMPRESSION: A degree of congestive heart failure remains. No airspace consolidation.   Electronically Signed   By: Bretta Bang M.D.  On: 02/15/2014 08:38    EKG: Independently reviewed. Sinus tachycardia with a heart rate of 101 beats per minute and nonspecific T-wave abnormalities. Not significantly different from previous EKG on 01/17/14.  Assessment/Plan Principal Problem:   Respiratory distress Active Problems:   Pulmonary edema   ESRD (end stage renal disease)   HTN (hypertension)   Thrombocytopenia, unspecified   Anemia in chronic kidney disease   Seizure disorder   COPD (chronic obstructive pulmonary disease)   1. Respiratory distress secondary to pulmonary edema from end-stage renal disease. The patient needs dialysis urgently, but he appears to be mentating and oxygenating better on BiPAP. It appears that his respiratory distress is subsiding. We will continue BiPAP until he is dialyzed. We'll continue supportive treatment with oxygen as well. We'll continue chronic Atrovent treatment and add when necessary albuterol. 2. Pulmonary edema secondary to  end-stage renal disease. Nephrology has been consulted and plans hemodialysis today. For further evaluation, we will order cardiac enzymes. We'll order a 2-D echocardiogram to evaluate for diastolic or systolic dysfunction. Of note, his TSH was within normal limits a few weeks ago. 3. COPD. No obvious wheezes on exam. We'll continue Atrovent nebulizer and will add when necessary albuterol. We'll continue chronic prednisone. 4. Malignant hypertension. He is treated chronically with lisinopril, Procardia, and carvedilol. His blood pressure was accelerated in the ED, likely, because he did not take his medications this morning and because of respiratory distress. These medications will be continued and when necessary hydralazine will be ordered. 5. Anemia in end-stage renal disease and iron deficiency. His hemoglobin is at baseline (ranges from 9.8-11.5.). Total iron was 10 and TIBC was 148 in July 2015. We'll continue multivitamin and ferrous sulfate. 6. Chronic thrombocytopenia. His platelet count is currently at baseline. His vitamin B12 level and TSH were within normal limits in July 2015. 7. Seizure disorder. Currently stable. We'll continue phenytoin.    Code Status: Full code DVT Prophylaxis: SCDs Family Communication: Discussed with patient's daughter and niece Disposition Plan: Anticipate discharge to home in 24-48 hours.  Time spent: One hour  [facility] Pager [phone]  **Disclaimer: This note may have been dictated with voice recognition software. Similar sounding words can inadvertently be transcribed and this note may contain transcription errors which may not have been corrected upon publication of note.**

## 2014-02-15 NOTE — Procedures (Signed)
   HEMODIALYSIS TREATMENT NOTE:  4 hour heparin-free dialysis completed via chest AVG (15g/antegrade).  Goal met:  Tolerated removal of 3.5 liters with no interruption in ultrafiltration.  All blood was reinfused and hemostasis was achieved within 10 minutes of needle removal.  Pt weaned to 1L O2 via [place].  Report given to [place], Therapist, sports.  Rockwell Alexandria, RN, CDN

## 2014-02-15 NOTE — ED Notes (Signed)
Sent from [facility] for eval for sob, htn, and tachycardia.  Per [facility], pt bp at facility 190/71 and HR 117.  Pt c/o sob, worse with exertion, states is new for him with non-productive cough.  States does get sob when "I drink more than I am suppose to."  Denies pain.

## 2014-02-15 NOTE — ED Notes (Signed)
Spoke with edp regarding bp meds and sending pt back to dialysis.  Orders received.  Pt reports breathing is getting worse, speaking shorter phrases with increased RR to 35-40 breaths /min.  edp notified.

## 2014-02-15 NOTE — Consult Note (Signed)
Reason for Consult: CHF and end-stage renal disease Referring Physician: Dr. Cranford Mon is an 68 y.o. male.  HPI: Is a patient was history of hypertension, history of failed kidney transplant and presently on dialysis came with complaints of difficulty in breathing since last night. According to the patient and his dialysis on Saturday and has been doing very well until yesterday. This morning when he wakes up was having some difficulty breathing, orthopnea he went to dialysis unit to get his dialysis however according to the patient he told him him in to go to the hospital. As this moment the reason why the patient was sent here was not clear. Patient does not have any chest pain in his  blood pressure is good  Past Medical History  Diagnosis Date  . ESRD (end stage renal disease) on dialysis     sat, tues, thurs  . Hypertension   . Seizure disorder   . Anemia in chronic kidney disease 01/18/2014  . COPD (chronic obstructive pulmonary disease)     History reviewed. No pertinent past surgical history.  Family History  Problem Relation Age of Onset  . Family history unknown: Yes    Social History:  reports that he has quit smoking. He does not have any smokeless tobacco history on file. He reports that he does not drink alcohol or use illicit drugs.  Allergies: No Known Allergies  Medications: I have reviewed the patient's current medications.  Results for orders placed during the hospital encounter of 02/15/14 (from the past 48 hour(s))  CBC WITH DIFFERENTIAL     Status: Abnormal   Collection Time    02/15/14  8:05 AM      Result Value Ref Range   WBC 8.4  4.0 - 10.5 K/uL   RBC 3.68 (*) 4.22 - 5.81 MIL/uL   Hemoglobin 10.9 (*) 13.0 - 17.0 g/dL   HCT 32.7 (*) 39.0 - 52.0 %   MCV 88.9  78.0 - 100.0 fL   MCH 29.6  26.0 - 34.0 pg   MCHC 33.3  30.0 - 36.0 g/dL   RDW 17.1 (*) 11.5 - 15.5 %   Platelets 126 (*) 150 - 400 K/uL   Neutrophils Relative % 84 (*) 43 - 77 %    Neutro Abs 7.0  1.7 - 7.7 K/uL   Lymphocytes Relative 9 (*) 12 - 46 %   Lymphs Abs 0.8  0.7 - 4.0 K/uL   Monocytes Relative 7  3 - 12 %   Monocytes Absolute 0.6  0.1 - 1.0 K/uL   Eosinophils Relative 0  0 - 5 %   Eosinophils Absolute 0.0  0.0 - 0.7 K/uL   Basophils Relative 0  0 - 1 %   Basophils Absolute 0.0  0.0 - 0.1 K/uL  PRO B NATRIURETIC PEPTIDE     Status: Abnormal   Collection Time    02/15/14  8:05 AM      Result Value Ref Range   Pro B Natriuretic peptide (BNP) >70000.0 (*) 0.0 - 100.0 pg/mL  BASIC METABOLIC PANEL     Status: Abnormal   Collection Time    02/15/14  9:05 AM      Result Value Ref Range   Sodium 142  137 - 147 mEq/L   Potassium 4.4  3.7 - 5.3 mEq/L   Chloride 100  96 - 112 mEq/L   CO2 20  19 - 32 mEq/L   Glucose, Bld 95  70 - 99 mg/dL  BUN 72 (*) 6 - 23 mg/dL   Creatinine, Ser 12.20 (*) 0.50 - 1.35 mg/dL   Calcium 9.3  8.4 - 10.5 mg/dL   GFR calc non Af Amer 4 (*) >90 mL/min   GFR calc Af Amer 4 (*) >90 mL/min   Comment: (NOTE)     The eGFR has been calculated using the CKD EPI equation.     This calculation has not been validated in all clinical situations.     eGFR's persistently <90 mL/min signify possible Chronic Kidney     Disease.  TROPONIN I     Status: None   Collection Time    02/15/14  9:05 AM      Result Value Ref Range   Troponin I <0.30  <0.30 ng/mL   Comment:            Due to the release kinetics of cTnI,     a negative result within the first hours     of the onset of symptoms does not rule out     myocardial infarction with certainty.     If myocardial infarction is still suspected,     repeat the test at appropriate intervals.  BLOOD GAS, ARTERIAL     Status: Abnormal   Collection Time    02/15/14 10:25 AM      Result Value Ref Range   O2 Content 2.0     pH, Arterial 7.431  7.350 - 7.450   pCO2 arterial 30.5 (*) 35.0 - 45.0 mmHg   pO2, Arterial 95.7  80.0 - 100.0 mmHg   Bicarbonate 19.9 (*) 20.0 - 24.0 mEq/L   TCO2  18.2  0 - 100 mmol/L   Acid-base deficit 3.7 (*) 0.0 - 2.0 mmol/L   O2 Saturation 96.7     Patient temperature 37.0     Collection site RIGHT RADIAL     Drawn by [id number]     Sample type ARTERIAL     Allens test (pass/fail) PASS  PASS  PHOSPHORUS     Status: None   Collection Time    02/15/14 12:24 PM      Result Value Ref Range   Phosphorus 4.0  2.3 - 4.6 mg/dL  TROPONIN I     Status: Abnormal   Collection Time    02/15/14 12:24 PM      Result Value Ref Range   Troponin I 0.38 (*) <0.30 ng/mL   Comment: CRITICAL RESULT CALLED TO, READ BACK BY AND VERIFIED WITH:     PHILLIPS,C AT 1300 BY HUFFINES,S ON 02/15/14                Due to the release kinetics of cTnI,     a negative result within the first hours     of the onset of symptoms does not rule out     myocardial infarction with certainty.     If myocardial infarction is still suspected,     repeat the test at appropriate intervals.    Dg Chest 2 View  02/15/2014   CLINICAL DATA:  Difficulty breathing  EXAM: CHEST  2 VIEW  COMPARISON:  January 19, 2014  FINDINGS: There is cardiomegaly with a minimal right effusion and mild residual edema consistent with a degree of residual congestive heart failure. There is no airspace consolidation. There is pulmonary venous hypertension. No adenopathy. There are surgical clips in the right axillary region.  IMPRESSION: A degree of congestive heart failure remains. No airspace consolidation.  Electronically Signed   By: Lowella Grip M.D.   On: 02/15/2014 08:38    Review of Systems  Constitutional: Negative for fever.  Respiratory: Positive for shortness of breath and wheezing. Negative for cough and sputum production.   Cardiovascular: Positive for orthopnea and PND. Negative for chest pain.  Gastrointestinal: Negative for nausea, vomiting and abdominal pain.  Neurological: Positive for weakness.   Blood pressure 164/101, pulse 92, temperature 101.1 F (38.4 C), temperature source  Axillary, resp. rate 20, height '5\' 8"'  (1.727 m), weight 61.236 kg (135 lb), SpO2 100.00%. Physical Exam  Constitutional: He is oriented to person, place, and time. No distress.  Eyes: No scleral icterus.  Neck: JVD present.  Cardiovascular: Normal rate and regular rhythm.   No murmur heard. Respiratory: No respiratory distress. He has wheezes. He has no rales.  GI: He exhibits no distension. There is no tenderness. There is no rebound.  Musculoskeletal: He exhibits no edema.  Neurological: He is alert and oriented to person, place, and time.    Assessment/Plan: Problem #1 difficulty in breathing: There is a combination of CHF and also possibly COPD. Presently patient is on facemask and is thought to saturation is better. Patient does not have any chest pain or orthopnea as this moment. Problem #2 hypertension: His blood pressure slightly high but overall seems to be controlled Problem #3 history of seizure disorder no recent seizure activity Problem #4 history of COPD Problem #5 history of gout Problem #6 anemia: His hemoglobin and hematocrit is was in our target goal Problem #7 metabolic bone disease: Calcium is range. Problem #8 history of thrombocytopenia Plan: Will dialyze patient 4 hours We'll try to get about 3-1/2 L his blood pressure tolerates. We'll continue his other medications as before. When patient is going to discharged he will  go to his regular dialysis unit.  Anwen Cannedy S 02/15/2014, 2:51 PM

## 2014-02-15 NOTE — Progress Notes (Signed)
  Echocardiogram 2D Echocardiogram has been performed.  Timothy Rodgers 02/15/2014, 4:11 PM

## 2014-02-16 DIAGNOSIS — J9600 Acute respiratory failure, unspecified whether with hypoxia or hypercapnia: Principal | ICD-10-CM

## 2014-02-16 DIAGNOSIS — J810 Acute pulmonary edema: Secondary | ICD-10-CM | POA: Diagnosis not present

## 2014-02-16 DIAGNOSIS — R0602 Shortness of breath: Secondary | ICD-10-CM | POA: Diagnosis not present

## 2014-02-16 DIAGNOSIS — R0609 Other forms of dyspnea: Secondary | ICD-10-CM | POA: Diagnosis not present

## 2014-02-16 DIAGNOSIS — J449 Chronic obstructive pulmonary disease, unspecified: Secondary | ICD-10-CM | POA: Diagnosis not present

## 2014-02-16 LAB — CBC
HCT: 28.9 % — ABNORMAL LOW (ref 39.0–52.0)
HEMOGLOBIN: 9.7 g/dL — AB (ref 13.0–17.0)
MCH: 30 pg (ref 26.0–34.0)
MCHC: 33.6 g/dL (ref 30.0–36.0)
MCV: 89.5 fL (ref 78.0–100.0)
PLATELETS: 107 10*3/uL — AB (ref 150–400)
RBC: 3.23 MIL/uL — AB (ref 4.22–5.81)
RDW: 17.2 % — ABNORMAL HIGH (ref 11.5–15.5)
WBC: 6 10*3/uL (ref 4.0–10.5)

## 2014-02-16 LAB — COMPREHENSIVE METABOLIC PANEL WITH GFR
ALT: 22 U/L (ref 0–53)
AST: 33 U/L (ref 0–37)
Albumin: 2.8 g/dL — ABNORMAL LOW (ref 3.5–5.2)
Alkaline Phosphatase: 126 U/L — ABNORMAL HIGH (ref 39–117)
Anion gap: 14 (ref 5–15)
BUN: 35 mg/dL — ABNORMAL HIGH (ref 6–23)
CO2: 27 meq/L (ref 19–32)
Calcium: 8.8 mg/dL (ref 8.4–10.5)
Chloride: 100 meq/L (ref 96–112)
Creatinine, Ser: 7.62 mg/dL — ABNORMAL HIGH (ref 0.50–1.35)
GFR calc Af Amer: 8 mL/min — ABNORMAL LOW
GFR calc non Af Amer: 7 mL/min — ABNORMAL LOW
Glucose, Bld: 81 mg/dL (ref 70–99)
Potassium: 4.1 meq/L (ref 3.7–5.3)
Sodium: 141 meq/L (ref 137–147)
Total Bilirubin: 1.4 mg/dL — ABNORMAL HIGH (ref 0.3–1.2)
Total Protein: 7.6 g/dL (ref 6.0–8.3)

## 2014-02-16 LAB — HEPATITIS B SURFACE ANTIGEN: Hepatitis B Surface Ag: NEGATIVE

## 2014-02-16 LAB — TROPONIN I: Troponin I: 0.79 ng/mL (ref <0.30)

## 2014-02-16 MED ORDER — VANCOMYCIN HCL IN DEXTROSE 750-5 MG/150ML-% IV SOLN
750.0000 mg | INTRAVENOUS | Status: DC
  Filled 2014-02-16 (×2): qty 150

## 2014-02-16 NOTE — Progress Notes (Signed)
Patient is insisting that he wants to leave.  I informed the MD and per MD, informed the patient that he would have to go against medical advice.  Patient states that he is waiting on his son to arrive. Will continue to monitor patient.

## 2014-02-16 NOTE — Progress Notes (Signed)
[facility] PROGRESS NOTE  Timothy Rodgers ZOX:[id number] DOB: August 15, 1945 DOA: 02/15/2014 PCP: No primary provider on file.  Assessment/Plan: 1. Acute respiratory failure, related to pulmonary edema. Patient required BiPAP on admission due to development of pulmonary edema. He underwent dialysis which has improved his symptoms. We'll continue to wean off oxygen as tolerated. He is no longer requiring BiPAP. 2. End-stage renal disease. Nephrology is following for dialysis. He undergoes dialysis on Tuesday, Thursday, Saturday. He does not report missing any treatments. He may need more dietary restriction/removal of more fluid during dialysis. 3. Fever. Patient was noted to have fever after admission. Due to his history of being on dialysis, he was started on vancomycin. Blood culture currently in process. 4. COPD. Lungs sound clear. Continue bronchodilators. Continue chronic prednisone. 5. Hypertension. On multiple agents. Pressure appears to be reasonably controlled. 6. Anemia of chronic kidney disease. Hemoglobin is near baseline. Continue to follow. 7. Seizure disorder. Continue Dilantin.  Code Status: Full code Family Communication: Discussed with patient and family at the bedside Disposition Plan: Discharge home once improved   Consultants:  Nephrology  Procedures:    Antibiotics:  Vancomycin 8/25  HPI/Subjective: Breathing is improving. Denies any cough, pain, any other complaints. Wants to go home  Objective: Filed Vitals:   02/16/14 1000  BP: 137/74  Pulse: 30  Temp:   Resp:     Intake/Output Summary (Last 24 hours) at 02/16/14 1309 Last data filed at 02/16/14 1000  Gross per 24 hour  Intake   1050 ml  Output   3500 ml  Net  -2450 ml   Filed Weights   02/15/14 1200 02/15/14 1835 02/16/14 0500  Weight: 61.236 kg (135 lb) 61.2 kg (134 lb 14.7 oz) 60.8 kg (134 lb 0.6 oz)    Exam:   General:  NAD  Cardiovascular: S1, S2 RRR  Respiratory:  crackles at bases  Abdomen: soft, nt, nd, bs+  Musculoskeletal: no pedal edema   Data Reviewed: Basic Metabolic Panel:  Recent Labs Lab 02/15/14 0905 02/15/14 1224 02/16/14 0422  NA 142  --  141  K 4.4  --  4.1  CL 100  --  100  CO2 20  --  27  GLUCOSE 95  --  81  BUN 72*  --  35*  CREATININE 12.20*  --  7.62*  CALCIUM 9.3  --  8.8  PHOS  --  4.0  --    Liver Function Tests:  Recent Labs Lab 02/16/14 0422  AST 33  ALT 22  ALKPHOS 126*  BILITOT 1.4*  PROT 7.6  ALBUMIN 2.8*   No results found for this basename: LIPASE, AMYLASE,  in the last 168 hours No results found for this basename: AMMONIA,  in the last 168 hours CBC:  Recent Labs Lab 02/15/14 0805 02/16/14 0422  WBC 8.4 6.0  NEUTROABS 7.0  --   HGB 10.9* 9.7*  HCT 32.7* 28.9*  MCV 88.9 89.5  PLT 126* 107*   Cardiac Enzymes:  Recent Labs Lab 02/15/14 0905 02/15/14 1224 02/15/14 1915 02/15/14 2338  TROPONINI <0.30 0.38* 0.73* 0.79*   BNP (last 3 results)  Recent Labs  01/17/14 2115 02/15/14 0805  PROBNP 54522.0* >70000.0*   CBG: No results found for this basename: GLUCAP,  in the last 168 hours  Recent Results (from the past 240 hour(s))  CULTURE, BLOOD (ROUTINE X 2)     Status: None   Collection Time    02/15/14  8:05 AM  Result Value Ref Range Status   Specimen Description BLOOD RIGHT ARM   Final   Special Requests BOTTLES DRAWN AEROBIC ONLY 3CC   Final   Culture NO GROWTH 1 DAY   Final   Report Status PENDING   Incomplete  MRSA PCR SCREENING     Status: None   Collection Time    02/15/14 12:25 PM      Result Value Ref Range Status   MRSA by PCR NEGATIVE  NEGATIVE Final   Comment:            The GeneXpert MRSA Assay (FDA     approved for NASAL specimens     only), is one component of a     comprehensive MRSA colonization     surveillance program. It is not     intended to diagnose MRSA     infection nor to guide or     monitor treatment for     MRSA infections.   CULTURE, BLOOD (ROUTINE X 2)     Status: None   Collection Time    02/15/14  5:56 PM      Result Value Ref Range Status   Specimen Description BLOOD LEFT ANTECUBITAL   Final   Special Requests BOTTLES DRAWN AEROBIC ONLY 16CC   Final   Culture NO GROWTH 1 DAY   Final   Report Status PENDING   Incomplete     Studies: Dg Chest 2 View  02/15/2014   CLINICAL DATA:  Difficulty breathing  EXAM: CHEST  2 VIEW  COMPARISON:  January 19, 2014  FINDINGS: There is cardiomegaly with a minimal right effusion and mild residual edema consistent with a degree of residual congestive heart failure. There is no airspace consolidation. There is pulmonary venous hypertension. No adenopathy. There are surgical clips in the right axillary region.  IMPRESSION: A degree of congestive heart failure remains. No airspace consolidation.   Electronically Signed   By: Bretta Bang M.D.   On: 02/15/2014 08:38    Scheduled Meds: . allopurinol  100 mg Oral Daily  . carvedilol  6.25 mg Oral BID  . docusate sodium  100 mg Oral BID  . lisinopril  20 mg Oral Daily  . multivitamin  1 tablet Oral QHS  . NIFEdipine  90 mg Oral Daily  . ondansetron  4 mg Oral TID  . phenytoin  200 mg Oral Daily   And  . phenytoin  300 mg Oral QHS  . predniSONE  5 mg Oral Q breakfast  . sevelamer carbonate  1,600 mg Oral TID WC  . [START ON 02/17/2014] vancomycin  750 mg Intravenous Q T,Th,Sa-HD   Continuous Infusions: . sodium chloride 10 mL/hr at 02/16/14 1000    Principal Problem:   Respiratory distress Active Problems:   Pulmonary edema   HTN (hypertension)   Thrombocytopenia, unspecified   Anemia in chronic kidney disease   Seizure disorder   ESRD (end stage renal disease)   COPD (chronic obstructive pulmonary disease)    Time spent:    Timothy Rodgers  [facility] Pager [phone]. If 7PM-7AM, please contact night-coverage at [email or web address], password [facility] 02/16/2014, 1:09 PM  LOS: 1 day

## 2014-02-16 NOTE — Progress Notes (Signed)
ANTIBIOTIC CONSULT NOTE - follow up  Pharmacy Consult for Vancomycin Indication: Fever, HD patient (FUO)  No Known Allergies  Patient Measurements: Height:  (172.7 cm) Weight: 134 lb 0.6 oz (60.8 kg) IBW/kg (Calculated) : 68.4  Vital Signs: Temp: 98.9 F (37.2 C) (08/26 0400) Temp src: Oral (08/26 0400) BP: 126/68 mmHg (08/26 0630) Pulse Rate: 85 (08/26 0630) Intake/Output from previous day: 08/25 0701 - 08/26 0700 In: 1010 [P.O.:820; I.V.:190] Out: 3500  Intake/Output from this shift:    Labs:  Recent Labs  02/15/14 0805 02/15/14 0905 02/16/14 0422  WBC 8.4  --  6.0  HGB 10.9*  --  9.7*  PLT 126*  --  107*  CREATININE  --  12.20* 7.62*   Estimated Creatinine Clearance: 8.2 ml/min (by C-G formula based on Cr of 7.62). No results found for this basename: VANCOTROUGH, Leodis Binet, VANCORANDOM, GENTTROUGH, GENTPEAK, GENTRANDOM, TOBRATROUGH, TOBRAPEAK, TOBRARND, AMIKACINPEAK, AMIKACINTROU, AMIKACIN,  in the last 72 hours   Microbiology: Recent Results (from the past 720 hour(s))  MRSA PCR SCREENING     Status: None   Collection Time    01/17/14 11:40 PM      Result Value Ref Range Status   MRSA by PCR NEGATIVE  NEGATIVE Final   Comment:            The GeneXpert MRSA Assay (FDA     approved for NASAL specimens     only), is one component of a     comprehensive MRSA colonization     surveillance program. It is not     intended to diagnose MRSA     infection nor to guide or     monitor treatment for     MRSA infections.  CULTURE, BLOOD (ROUTINE X 2)     Status: None   Collection Time    01/18/14  7:26 AM      Result Value Ref Range Status   Specimen Description BLOOD RIGHT HAND   Final   Special Requests BOTTLES DRAWN AEROBIC AND ANAEROBIC 7CC   Final   Culture NO GROWTH 5 DAYS   Final   Report Status 01/23/2014 FINAL   Final  CULTURE, BLOOD (ROUTINE X 2)     Status: None   Collection Time    01/18/14  7:26 AM      Result Value Ref Range Status   Specimen Description BLOOD RIGHT ARM   Final   Special Requests BOTTLES DRAWN AEROBIC AND ANAEROBIC 6CC   Final   Culture NO GROWTH 5 DAYS   Final   Report Status 01/23/2014 FINAL   Final  MRSA PCR SCREENING     Status: None   Collection Time    02/15/14 12:25 PM      Result Value Ref Range Status   MRSA by PCR NEGATIVE  NEGATIVE Final   Comment:            The GeneXpert MRSA Assay (FDA     approved for NASAL specimens     only), is one component of a     comprehensive MRSA colonization     surveillance program. It is not     intended to diagnose MRSA     infection nor to guide or     monitor treatment for     MRSA infections.   Medical History: Past Medical History  Diagnosis Date  . ESRD (end stage renal disease) on dialysis     sat, tues, thurs  . Hypertension   .  Seizure disorder   . Anemia in chronic kidney disease 01/18/2014  . COPD (chronic obstructive pulmonary disease)   . Gout   . CVA (cerebral infarction)     Mild left-sided weakness, residual   Medications:  Scheduled:  . allopurinol  100 mg Oral Daily  . carvedilol  6.25 mg Oral BID  . docusate sodium  100 mg Oral BID  . lisinopril  20 mg Oral Daily  . multivitamin  1 tablet Oral QHS  . NIFEdipine  90 mg Oral Daily  . ondansetron  4 mg Oral TID  . phenytoin  200 mg Oral Daily   And  . phenytoin  300 mg Oral QHS  . predniSONE  5 mg Oral Q breakfast  . sevelamer carbonate  1,600 mg Oral TID WC  . [START ON 02/17/2014] vancomycin  750 mg Intravenous Q T,Th,Sa-HD   Assessment: 68yo male with ESRD requiring dialysis.  Asked to initiate Vancomycin as pt has fever.  Source of fever is unclear.  Pt received Vancomycin on 8/25 with dialysis.    Goal of Therapy:  Pre-Hemodialysis Vancomycin level goal range =15-25 mcg/ml  Plan:  Vancomycin  IV after each dialysis. Normal dialysis days are Tues/Thurs/Sat Labs per protocol  Valrie Hart A 02/16/2014,9:05 AM

## 2014-02-16 NOTE — Evaluation (Addendum)
Physical Therapy Evaluation Patient Details Name: Timothy Rodgers MRN: [id number] DOB: [birth year] Today's Date: 02/16/2014   History of Present Illness  Timothy Rodgers is a 68 y.o. male with a history of end-stage renal disease on hemodialysis, hypertension, COPD, and gout, who presents to the hospital today with a complaint of shortness of breath. He was recently hospitalized from 01/17/2014 through 01/19/2014 for pulmonary edema secondary to end-stage renal disease. This morning, apparently, he presented to the dialysis center this morning. He complained of shortness of breath. He also complained of mild swelling in his legs. The staff at the dialysis center advised the patient to come to the emergency department for further evaluation. He was placed on BiPAP and remained on BiPAP during the history and physical. Currently, he has no complaints of chest pain and shortness of breath on BiPAP. He denies cough, fever, chills, pleurisy, nausea, vomiting, diarrhea, or abdominal swelling. He is still visiting from [place] and plans to fly back to [place] next week.  In the ED, he was afebrile mildly tachycardic and hypertensive with a blood pressure of 196/100. He was oxygenating 100% on BiPAP/supplemental oxygen. His EKG reveals sinus tachycardia with nonspecific T-wave and a heart rate of 101 beats per minute. His chest x-ray revealed a degree of congestive heart failure, but no airspace consolidation. His ABG revealed a pH of 7.4, PCO2 of 31, and PO2 of 96. His lab data were significant for a pro BNP of greater than 70,000, normal troponin I., BUN of 72, creatinine of 12.2, and normal potassium of 4.4. Platelet count 126 and hemoglobin 10.9.  Clinical Impression  Pt is a 68 year old male who presents to PT with dx of respiratory distress.  Pt up in chair, without O2, and reports no feelings of SOB or pain when PT entered room. Pt reports assist x1 for transfer to [facility] and into chair this morning.  Pt  does have a hx of stroke resulting in Lt UE and ankle weakness.  Pt reports he has a std cane, though only uses it "sometimes".  During evaluation, pt mod (I) with transfers, and mod (I)/superivison with gait for 175 feet.  Pt initiated gait with std cane in the Rt hand, though was able to transition to amb without AD halfway through gait assessment.  Noted decreased foot clearance on the Lt (due to inability to dorsiflex foot from hx of stroke) and decreased step length Rt, though pt reports this is how he normally walks.  Pt reports he feels he is able to care for himself and is moving around as he is normally able to at home.  Pt will be d/c from acute PT services as he is at baseline level of function.  No follow up PT or DME recommendations at this time.      Follow Up Recommendations No PT follow up    Equipment Recommendations  None recommended by PT       Precautions / Restrictions Precautions Precautions: None Precaution Comments: Hx of stroke with resultant Lt sided hemiparesis      Mobility  Bed Mobility               General bed mobility comments: Not assessed as pt up in chair prior to PT evaluation  Transfers Overall transfer level: Modified independent                  Ambulation/Gait Ambulation/Gait assistance: Modified independent (Device/Increase time);Supervision Ambulation Distance (Feet): 175 Feet Assistive device: Straight  cane Gait Pattern/deviations: Step-to pattern;Decreased dorsiflexion - left;Decreased step length - right   Gait velocity interpretation: Below normal speed for age/gender General Gait Details: Pt alternated with ambulation with and without use of std cane      Balance Overall balance assessment: No apparent balance deficits (not formally assessed)                                           Pertinent Vitals/Pain Pain Assessment: No/denies pain    Home Living Family/patient expects to be discharged to::  Private residence Living Arrangements: Spouse/significant other Available Help at Discharge: Family;Available PRN/intermittently (Wife works 3rd shift) Type of Home: House Home Access: Stairs to enter Entrance Stairs-Rails: None Secretary/administrator of Steps: 1 Home Layout: One level Home Equipment: Cane - single point;Walker - 2 wheels      Prior Function Level of Independence: Independent;Independent with assistive device(s)         Comments: Pt reprots (I) with bed mobility skills and transfers, and use of std cane for amb "sometimes"     Hand Dominance   Dominant Hand: Right    Extremity/Trunk Assessment   Upper Extremity Assessment: LUE deficits/detail       LUE Deficits / Details: Hx of Lt sided hemiparesis   Lower Extremity Assessment: Overall WFL for tasks assessed;LLE deficits/detail   LLE Deficits / Details: Hx of Lt sided hemiparesis with residual 0/5 MMT Lt ankle     Communication   Communication: No difficulties  Cognition Arousal/Alertness: Awake/alert Behavior During Therapy: WFL for tasks assessed/performed Overall Cognitive Status: Within Functional Limits for tasks assessed                        Assessment/Plan    PT Assessment Patent does not need any further PT services  PT Diagnosis     PT Problem List    PT Treatment Interventions     PT Goals (Current goals can be found in the Care Plan section) Acute Rehab PT Goals PT Goal Formulation: No goals set, d/c therapy     End of Session Equipment Utilized During Treatment: Gait belt Activity Tolerance: Patient tolerated treatment well Patient left: in chair;with call bell/phone within reach           Time: 1420-1444 PT Time Calculation (min): 24 min   Charges:   PT Evaluation $Initial PT Evaluation Tier I: 1 Procedure      Xuan Mateus 02-17-2014, 2:49 PM  ADDENDUM : G-codes : Mobility current/goal/discharge CI

## 2014-02-16 NOTE — Progress Notes (Signed)
Nurse in room with pt, assessing pt and administering medications. Family at bedside. Pt voices no complaints at this time. Pt A&O X4. Bed is in lowest position & call bell is within reach. Will continue to monitor pt frequently throughout night.

## 2014-02-16 NOTE — Clinical Documentation Improvement (Signed)
Possible Clinical Conditions?   Acute Pulmonary Edema Acute on Chronic Pulmonary Edema Chronic Systolic Congestive Heart Failure Chronic Diastolic Congestive Heart Failure Chronic Systolic & Diastolic Congestive Heart Failure Acute Systolic Congestive Heart Failure Acute Diastolic Congestive Heart Failure Acute Systolic & Diastolic Congestive Heart Failure Acute on Chronic Systolic Congestive Heart Failure Acute on Chronic Diastolic Congestive Heart Failure Acute on Chronic Systolic & Diastolic Congestive Heart Failure Other Condition Cannot Clinically Determine     Risk Factors: CHF noted per 8/25 progress notes. Pulmonary Edema per 8/26 progress notes.  Diagnostics: 8/25: proBNP: >7,000.0 8/25: CXR: a degree of CHF remains.   Thank You, Marciano Sequin, Clinical Documentation Specialist:  [phone]  [facility]- Health Information Management

## 2014-02-16 NOTE — Progress Notes (Signed)
Timothy Rodgers  MRN: [id number]  DOB/AGE: 1946-01-29 68 y.o.  Primary Care Physician:No primary provider on file.  Admit date: 02/15/2014  Chief Complaint:  Chief Complaint  Patient presents with  . Shortness of Breath    S-Pt presented on  02/15/2014 with  Chief Complaint  Patient presents with  . Shortness of Breath  .    Pt today feels better  Meds . allopurinol  100 mg Oral Daily  . carvedilol  6.25 mg Oral BID  . docusate sodium  100 mg Oral BID  . lisinopril  20 mg Oral Daily  . multivitamin  1 tablet Oral QHS  . NIFEdipine  90 mg Oral Daily  . ondansetron  4 mg Oral TID  . phenytoin  200 mg Oral Daily   And  . phenytoin  300 mg Oral QHS  . predniSONE  5 mg Oral Q breakfast  . sevelamer carbonate  1,600 mg Oral TID WC      Physical Exam: Vital signs in last 24 hours: Temp:  [98.1 F (36.7 C)-101.1 F (38.4 C)] 99 F (37.2 C) (08/25 1835) Pulse Rate:  [27-107] 90 (08/26 0100) Resp:  [17-39] 25 (08/26 0100) BP: (122-196)/(59-107) 122/70 mmHg (08/26 0100) SpO2:  [85 %-100 %] 96 % (08/26 0100) FiO2 (%):  [32 %-40 %] 32 % (08/25 1400) Weight:  [130 lb (58.968 kg)-135 lb (61.236 kg)] 134 lb 14.7 oz (61.2 kg) (08/25 1835) Weight change:     Intake/Output from previous day: 08/25 0701 - 08/26 0700 In: 870 [P.O.:820; I.V.:50] Out: 3500  Total I/O In: 100 [P.O.:100] Out: 3500 [Other:3500]   Physical Exam: General- pt is awake,alert, oriented to time place and person Resp- No acute REsp distress, CTA B/L NO Rhonchi CVS- S1S2 regular ij rate and rhythm GIT- BS+, soft, NT, ND EXT- NO LE Edema, Cyanosis Access -Chest AVG+  Lab Results: CBC  Recent Labs  02/15/14 0805  WBC 8.4  HGB 10.9*  HCT 32.7*  PLT 126*    BMET  Recent Labs  02/15/14 0905  NA 142  K 4.4  CL 100  CO2 20  GLUCOSE 95  BUN 72*  CREATININE 12.20*  CALCIUM 9.3    MICRO Recent Results (from the past 240 hour(s))  MRSA PCR SCREENING     Status: None   Collection Time    02/15/14 12:25 PM      Result Value Ref Range Status   MRSA by PCR NEGATIVE  NEGATIVE Final   Comment:            The GeneXpert MRSA Assay (FDA     approved for NASAL specimens     only), is one component of a     comprehensive MRSA colonization     surveillance program. It is not     intended to diagnose MRSA     infection nor to guide or     monitor treatment for     MRSA infections.      Lab Results  Component Value Date   CALCIUM 9.3 02/15/2014   PHOS 4.0 02/15/2014     Impression: 1)Renal  ESRd on HD ESRD since 20 + years S/p Failed transplant Now on HD -On TTS as outpt schedule. Pt dialyzed yesterday. No need of Hd today .   2)HTN  Medication- On RAS blockers- On Calcium Channel Blockers On Alpha and beta Blockers  3)Anemia HGb at goal (9--11)   4)CKD Mineral-Bone Disorder Phosphorus at goal. Calcium at goal.  5)Hypervolemia/Pul edema Admitted with Fluid overload  Now better  6)Electrolytes  Normokalemic NOrmonatremic   7)Acid base Co2 at goal     Plan: Will continue current care. No need of Hd today     Icis Budreau S 02/16/2014, 5:09 AM

## 2014-02-16 NOTE — Care Management Note (Signed)
    Page 1 of 1   02/16/2014     3:13:32 PM CARE MANAGEMENT NOTE 02/16/2014  Patient:  ISAO, SELTZER   Account Number:  [id number]  Date Initiated:  02/16/2014  Documentation initiated by:  Sharrie Rothman  Subjective/Objective Assessment:   Pt admitted from home with respiratory distress. Pt is from [place] and here visiting his children. Pt is returning home to [place] on 02/21/14. Pt is receiving dialysis T, TH, Sat while visiting. Pt is independent with ADl's.     Action/Plan:   No CM needs noted. Pt will followup with his PCP when he returns to [place].   Anticipated DC Date:  02/18/2014   Anticipated DC Plan:  HOME/SELF CARE      DC Planning Services  CM consult      Choice offered to / List presented to:             Status of service:  Completed, signed off Medicare Important Message given?   (If response is "NO", the following Medicare IM given date fields will be blank) Date Medicare IM given:   Medicare IM given by:   Date Additional Medicare IM given:   Additional Medicare IM given by:    Discharge Disposition:  HOME/SELF CARE  Per UR Regulation:    If discussed at Long Length of Stay Meetings, dates discussed:    Comments:  02/16/14 1515 Arlyss Queen, RN BSN CM

## 2014-02-17 DIAGNOSIS — J9600 Acute respiratory failure, unspecified whether with hypoxia or hypercapnia: Secondary | ICD-10-CM | POA: Diagnosis not present

## 2014-02-17 DIAGNOSIS — R509 Fever, unspecified: Secondary | ICD-10-CM

## 2014-02-17 DIAGNOSIS — J449 Chronic obstructive pulmonary disease, unspecified: Secondary | ICD-10-CM | POA: Diagnosis not present

## 2014-02-17 DIAGNOSIS — N186 End stage renal disease: Secondary | ICD-10-CM

## 2014-02-17 DIAGNOSIS — J810 Acute pulmonary edema: Secondary | ICD-10-CM | POA: Diagnosis not present

## 2014-02-17 DIAGNOSIS — R0609 Other forms of dyspnea: Secondary | ICD-10-CM | POA: Diagnosis not present

## 2014-02-17 DIAGNOSIS — R0602 Shortness of breath: Secondary | ICD-10-CM | POA: Diagnosis not present

## 2014-02-17 DIAGNOSIS — R0989 Other specified symptoms and signs involving the circulatory and respiratory systems: Secondary | ICD-10-CM | POA: Diagnosis not present

## 2014-02-17 LAB — CBC
HCT: 29.5 % — ABNORMAL LOW (ref 39.0–52.0)
Hemoglobin: 9.7 g/dL — ABNORMAL LOW (ref 13.0–17.0)
MCH: 29 pg (ref 26.0–34.0)
MCHC: 32.9 g/dL (ref 30.0–36.0)
MCV: 88.1 fL (ref 78.0–100.0)
Platelets: 104 10*3/uL — ABNORMAL LOW (ref 150–400)
RBC: 3.35 MIL/uL — AB (ref 4.22–5.81)
RDW: 16.7 % — ABNORMAL HIGH (ref 11.5–15.5)
WBC: 5.1 10*3/uL (ref 4.0–10.5)

## 2014-02-17 LAB — BASIC METABOLIC PANEL
Anion gap: 15 (ref 5–15)
BUN: 58 mg/dL — ABNORMAL HIGH (ref 6–23)
CO2: 27 meq/L (ref 19–32)
CREATININE: 10.25 mg/dL — AB (ref 0.50–1.35)
Calcium: 8.9 mg/dL (ref 8.4–10.5)
Chloride: 98 mEq/L (ref 96–112)
GFR calc Af Amer: 5 mL/min — ABNORMAL LOW (ref >90)
GFR calc non Af Amer: 5 mL/min — ABNORMAL LOW (ref >90)
Glucose, Bld: 85 mg/dL (ref 70–99)
Potassium: 4.3 mEq/L (ref 3.7–5.3)
Sodium: 140 mEq/L (ref 137–147)

## 2014-02-17 NOTE — Progress Notes (Signed)
Subjective: Interval History: has no complaint of difficulty in breathing. Patient presently denies any orthopnea. His appetite is good..  Objective: Vital signs in last 24 hours: Temp:  [98.2 F (36.8 C)-98.5 F (36.9 C)] 98.2 F (36.8 C) (08/27 0535) Pulse Rate:  [30-84] 76 (08/27 0535) Resp:  [18-20] 20 (08/27 0535) BP: (136-148)/(68-78) 143/76 mmHg (08/27 0535) SpO2:  [89 %-100 %] 100 % (08/27 0535) Weight:  [58.832 kg (129 lb 11.2 oz)] 58.832 kg (129 lb 11.2 oz) (08/27 0535) Weight change: -0.136 kg (-4.8 oz)  Intake/Output from previous day: 08/26 0701 - 08/27 0700 In: 270 [P.O.:240; I.V.:30] Out: -  Intake/Output this shift:    General appearance: alert, cooperative and no distress Resp: wheezes bilaterally Cardio: regular rate and rhythm, S1, S2 normal, no murmur, click, rub or gallop GI: soft, non-tender; bowel sounds normal; no masses,  no organomegaly Extremities: extremities normal, atraumatic, no cyanosis or edema  Lab Results:  Recent Labs  02/16/14 0422 02/17/14 0616  WBC 6.0 5.1  HGB 9.7* 9.7*  HCT 28.9* 29.5*  PLT 107* 104*   BMET:  Recent Labs  02/16/14 0422 02/17/14 0616  NA 141 140  K 4.1 4.3  CL 100 98  CO2 27 27  GLUCOSE 81 85  BUN 35* 58*  CREATININE 7.62* 10.25*  CALCIUM 8.8 8.9   No results found for this basename: PTH,  in the last 72 hours Iron Studies: No results found for this basename: IRON, TIBC, TRANSFERRIN, FERRITIN,  in the last 72 hours  Studies/Results: Dg Chest 2 View  02/15/2014   CLINICAL DATA:  Difficulty breathing  EXAM: CHEST  2 VIEW  COMPARISON:  January 19, 2014  FINDINGS: There is cardiomegaly with a minimal right effusion and mild residual edema consistent with a degree of residual congestive heart failure. There is no airspace consolidation. There is pulmonary venous hypertension. No adenopathy. There are surgical clips in the right axillary region.  IMPRESSION: A degree of congestive heart failure remains. No  airspace consolidation.   Electronically Signed   By: Bretta Bang M.D.   On: 02/15/2014 08:38    I have reviewed the patient's current medications.  Assessment/Plan: Problem #1 difficulty in breathing: This is a combination of CHF and COPD. Patient is status post hemodialysis and presently seems to be feeling better. Presently patient is staying without oxygen. Problem #2 hypertension: His blood pressure is reasonably controlled Problem #3 anemia: His hemoglobin and hematocrit is below her target range and patient is on Epogen. Problem #4 metabolic bone disease: His calcium and phosphorus is range Problem #5 history of seizure disorder Problem #6 history of COPD Plan: We'll make arrangements for patient to get dialysis today His next dialysis will be on Saturday which is his regular schedule. And patient is going to be discharged we'll go to his regular unit    LOS: 2 days   Iantha Titsworth S 02/17/2014,8:27 AM

## 2014-02-17 NOTE — Discharge Summary (Signed)
Physician Discharge Summary  Timothy Rodgers ZOX:[id number] DOB: June 21, 1946 DOA: 02/15/2014  PCP: No primary provider on file.  Admit date: 02/15/2014 Discharge date: 02/17/2014  Time spent: 40 minutes  Recommendations for Outpatient Follow-up:  1. Follow up the dialysis unit on 8/29 as previously scheduled  Discharge Diagnoses:  Principal Problem:   Respiratory distress Active Problems:   Pulmonary edema   HTN (hypertension)   Thrombocytopenia, unspecified   Anemia in chronic kidney disease   Seizure disorder   ESRD (end stage renal disease)   COPD (chronic obstructive pulmonary disease)   Discharge Condition: Improved  Diet recommendation: Low-salt  Filed Weights   02/17/14 0535 02/17/14 1045 02/17/14 1510  Weight: 58.832 kg (129 lb 11.2 oz) 58.7 kg (129 lb 6.6 oz) 55.6 kg (122 lb 9.2 oz)    History of present illness and hospital course:  This patient was admitted to the hospital with respiratory distress, acute respiratory failure related to pulmonary edema. He is a dialysis patient. He underwent urgent dialysis with improvement in symptoms. He did initially require BiPAP therapy for respiratory support, but has been weaned down to room air. After several sessions of hemodialysis, he appears to be back to his baseline. He was noted to have a fever during his hospitalization and was started empirically on vancomycin. Chest x-ray did not show any pneumonia. Blood cultures which did not show any significant growth and therefore antibiotics were discontinued. He remained afebrile for the last 48 hours and does not have any significant leukocytosis. He plans to followup with his regular dialysis unit on 8/29 for hemodialysis. The patient plans on returning to [place] in the next week.  Procedures:    Consultations:  Nephrology  Discharge Exam: Filed Vitals:   02/17/14 1533  BP: 120/66  Pulse:   Temp:   Resp:     General: NAD Cardiovascular: S1, S2  RRR Respiratory: CTA B  Discharge Instructions You were cared for by a hospitalist during your hospital stay. If you have any questions about your discharge medications or the care you received while you were in the hospital after you are discharged, you can call the unit and asked to speak with the hospitalist on call if the hospitalist that took care of you is not available. Once you are discharged, your primary care physician will handle any further medical issues. Please note that NO REFILLS for any discharge medications will be authorized once you are discharged, as it is imperative that you return to your primary care physician (or establish a relationship with a primary care physician if you do not have one) for your aftercare needs so that they can reassess your need for medications and monitor your lab values.  Discharge Instructions   Call MD for:  difficulty breathing, headache or visual disturbances    Complete by:  As directed      Call MD for:  temperature >100.4    Complete by:  As directed      Diet - low sodium heart healthy    Complete by:  As directed      Increase activity slowly    Complete by:  As directed             Medication List         acetaminophen 325 MG tablet  Commonly known as:  TYLENOL  Take 650 mg by mouth every 6 (six) hours as needed for mild pain.     allopurinol 100 MG tablet  Commonly known as:  ZYLOPRIM  Take 100 mg by mouth daily.     b complex-vitamin c-folic acid 0.8 MG Tabs tablet  Take 1 tablet by mouth daily.     carvedilol 6.25 MG tablet  Commonly known as:  COREG  Take 6.25 mg by mouth 2 (two) times daily.     ipratropium 17 MCG/ACT inhaler  Commonly known as:  ATROVENT HFA  Inhale 1 puff into the lungs every 6 (six) hours.     lisinopril 20 MG tablet  Commonly known as:  PRINIVIL,ZESTRIL  Take 20 mg by mouth daily.     NIFEdipine 90 MG 24 hr tablet  Commonly known as:  PROCARDIA XL/ADALAT-CC  Take 90 mg by mouth daily.      ondansetron 4 MG tablet  Commonly known as:  ZOFRAN  Take 4 mg by mouth 3 (three) times daily.     phenytoin 100 MG ER capsule  Commonly known as:  DILANTIN  Take 200-300 mg by mouth 2 (two) times daily. Two in the morning and three at bedtime     predniSONE 5 MG tablet  Commonly known as:  DELTASONE  Take 5 mg by mouth daily with breakfast.     sevelamer carbonate 800 MG tablet  Commonly known as:  RENVELA  Take 800-1,600 mg by mouth 3 (three) times daily with meals. 2 with meals and one with snacks       No Known Allergies     Follow-up Information   Please follow up. (You need to go directly to dialysis)        The results of significant diagnostics from this hospitalization (including imaging, microbiology, ancillary and laboratory) are listed below for reference.    Significant Diagnostic Studies: Dg Chest 2 View  02/15/2014   CLINICAL DATA:  Difficulty breathing  EXAM: CHEST  2 VIEW  COMPARISON:  January 19, 2014  FINDINGS: There is cardiomegaly with a minimal right effusion and mild residual edema consistent with a degree of residual congestive heart failure. There is no airspace consolidation. There is pulmonary venous hypertension. No adenopathy. There are surgical clips in the right axillary region.  IMPRESSION: A degree of congestive heart failure remains. No airspace consolidation.   Electronically Signed   By: Bretta Bang M.D.   On: 02/15/2014 08:38   Dg Chest Port 1 View  01/19/2014   CLINICAL DATA:  End-stage renal disease  EXAM: PORTABLE CHEST - 1 VIEW  COMPARISON:  January 17, 2014  FINDINGS: There has been interval partial clearing of interstitial edema. Mild edema does remain. No airspace consolidation. There is cardiomegaly with pulmonary venous hypertension. No adenopathy. There are surgical clips in the right axilla  IMPRESSION: Congestive heart failure remains, although there is less interstitial edema compared to recent prior study. No new opacity.    Electronically Signed   By: Bretta Bang M.D.   On: 01/19/2014 07:54    Microbiology: Recent Results (from the past 240 hour(s))  CULTURE, BLOOD (ROUTINE X 2)     Status: None   Collection Time    02/15/14  8:05 AM      Result Value Ref Range Status   Specimen Description BLOOD RIGHT ARM   Final   Special Requests BOTTLES DRAWN AEROBIC ONLY 3CC   Final   Culture NO GROWTH 2 DAYS   Final   Report Status PENDING   Incomplete  MRSA PCR SCREENING     Status: None   Collection Time    02/15/14 12:25 PM  Result Value Ref Range Status   MRSA by PCR NEGATIVE  NEGATIVE Final   Comment:            The GeneXpert MRSA Assay (FDA     approved for NASAL specimens     only), is one component of a     comprehensive MRSA colonization     surveillance program. It is not     intended to diagnose MRSA     infection nor to guide or     monitor treatment for     MRSA infections.  CULTURE, BLOOD (ROUTINE X 2)     Status: None   Collection Time    02/15/14  5:56 PM      Result Value Ref Range Status   Specimen Description BLOOD LEFT ANTECUBITAL   Final   Special Requests BOTTLES DRAWN AEROBIC ONLY 16CC   Final   Culture NO GROWTH 2 DAYS   Final   Report Status PENDING   Incomplete     Labs: Basic Metabolic Panel:  Recent Labs Lab 02/15/14 0905 02/15/14 1224 02/16/14 0422 02/17/14 0616  NA 142  --  141 140  K 4.4  --  4.1 4.3  CL 100  --  100 98  CO2 20  --  27 27  GLUCOSE 95  --  81 85  BUN 72*  --  35* 58*  CREATININE 12.20*  --  7.62* 10.25*  CALCIUM 9.3  --  8.8 8.9  PHOS  --  4.0  --   --    Liver Function Tests:  Recent Labs Lab 02/16/14 0422  AST 33  ALT 22  ALKPHOS 126*  BILITOT 1.4*  PROT 7.6  ALBUMIN 2.8*   No results found for this basename: LIPASE, AMYLASE,  in the last 168 hours No results found for this basename: AMMONIA,  in the last 168 hours CBC:  Recent Labs Lab 02/15/14 0805 02/16/14 0422 02/17/14 0616  WBC 8.4 6.0 5.1  NEUTROABS 7.0   --   --   HGB 10.9* 9.7* 9.7*  HCT 32.7* 28.9* 29.5*  MCV 88.9 89.5 88.1  PLT 126* 107* 104*   Cardiac Enzymes:  Recent Labs Lab 02/15/14 0905 02/15/14 1224 02/15/14 1915 02/15/14 2338  TROPONINI <0.30 0.38* 0.73* 0.79*   BNP: BNP (last 3 results)  Recent Labs  01/17/14 2115 02/15/14 0805  PROBNP 54522.0* >70000.0*   CBG: No results found for this basename: GLUCAP,  in the last 168 hours     Signed:  Suzzane Quilter  [facility] 02/17/2014, 8:01 PM

## 2014-02-17 NOTE — Procedures (Signed)
   HEMODIALYSIS TREATMENT NOTE:  4 hour heparin-free dialysis completed via right chest wall AVG.  Small infiltration of arterial limb upon cannulation with 15g needle.  Swelling resolved with ice/pressure.  Recannulated without problems with 16g needle; average Qb 38m/min.  Goal met:  Tolerated removal of 3 liters without interruption in ultrafiltration.  All blood was reinfused.  Vancomycin $RemoveBef'750mg'[id number]$  order was noted when patient had less than one hour left on HD.  Administration deferred to primary RN.  Hemostasis was achieved within 12 minutes.  Report given to Tedd Sias, RN.  Lateia Fraser L. Dilraj Killgore, RN, CDN

## 2014-02-20 LAB — CULTURE, BLOOD (ROUTINE X 2)
Culture: NO GROWTH
Culture: NO GROWTH

## 2015-09-13 IMAGING — CR DG CHEST 2V
2 series · 2 of 2 positions shown · non-contrast
Comparison: January 19, 2014

CLINICAL DATA: Difficulty breathing

EXAM:
CHEST  2 VIEW

[view not recorded (1 of 2)]
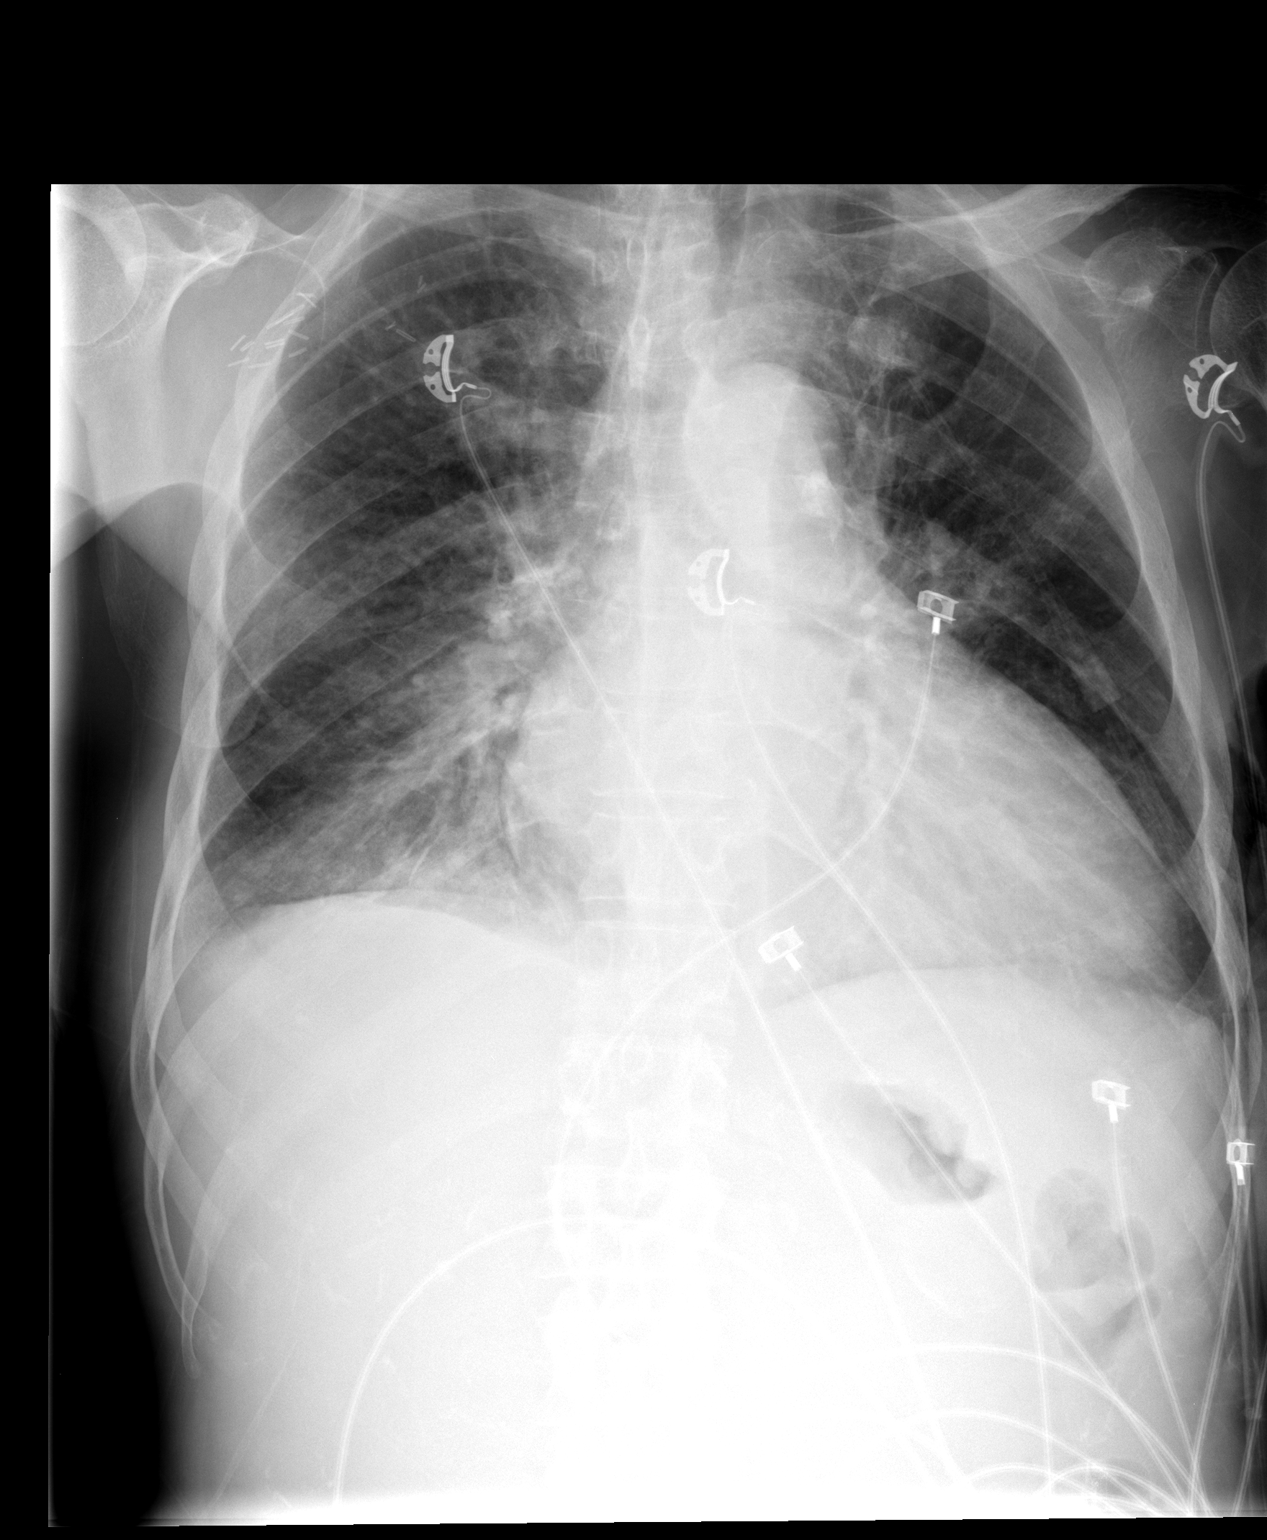

[view not recorded (2 of 2)]
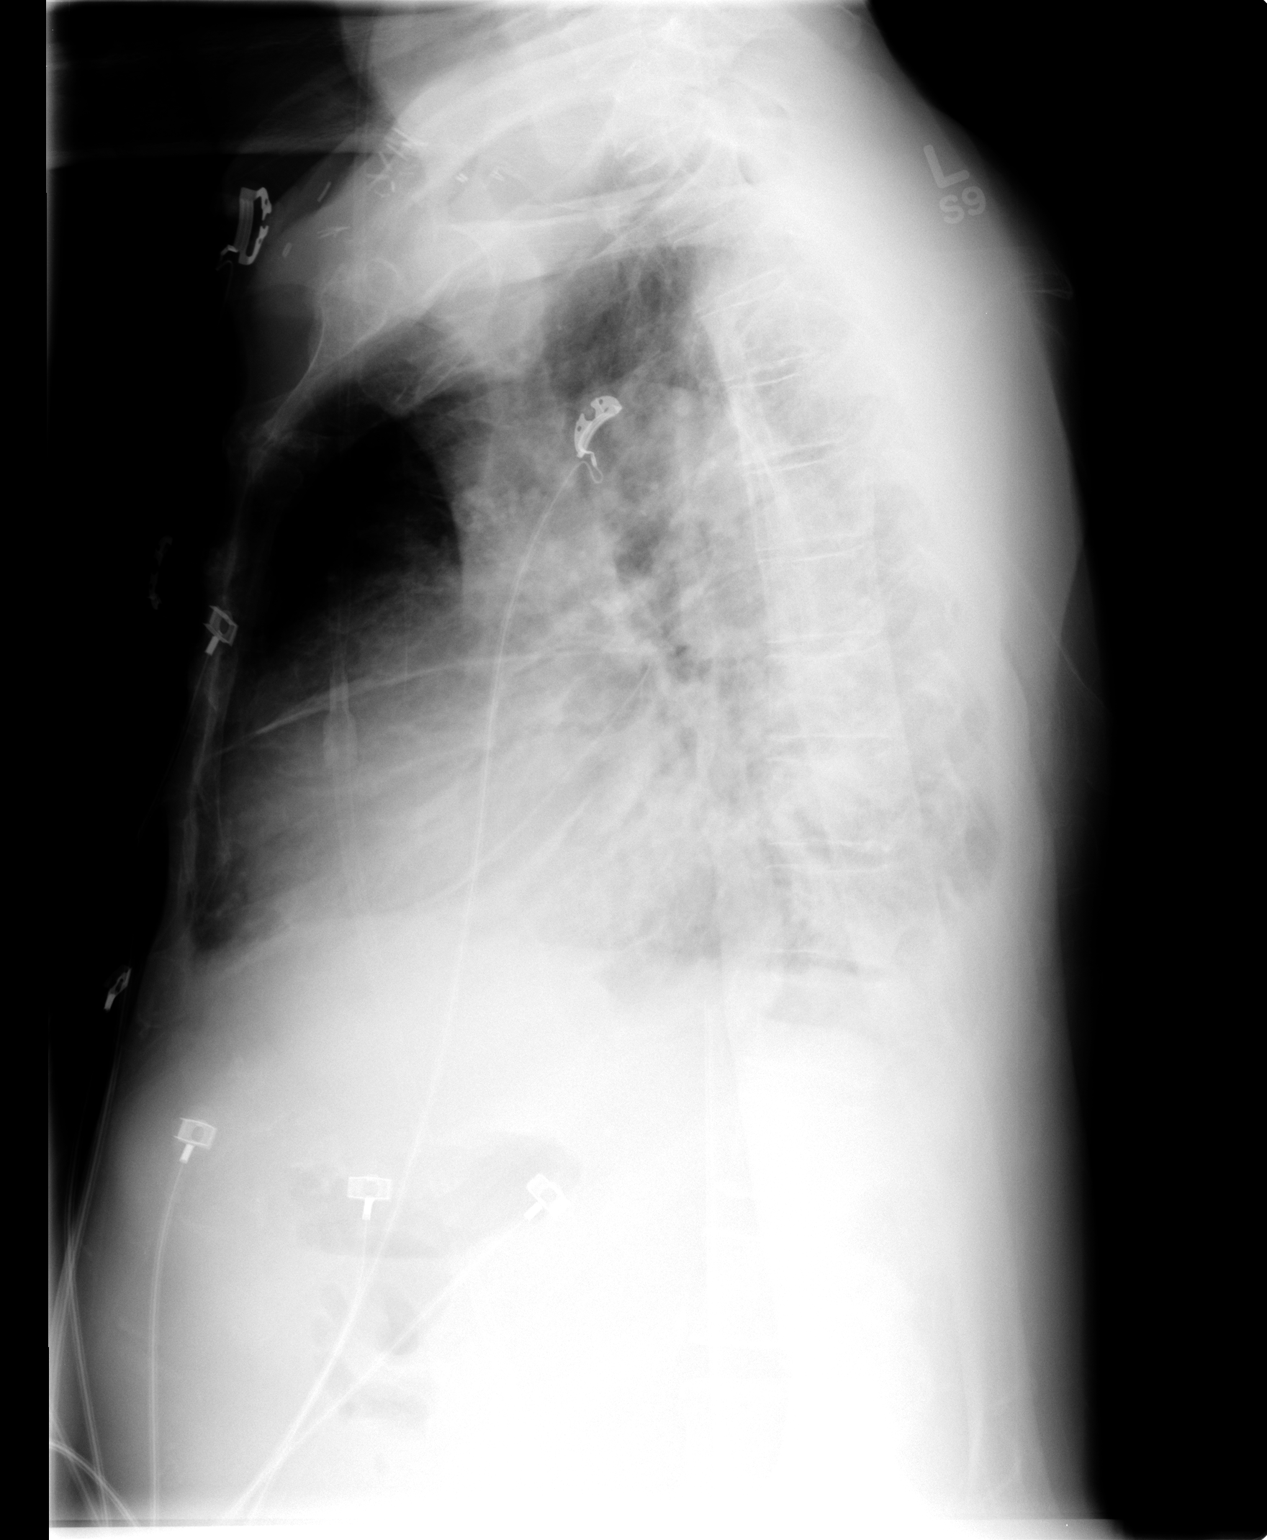

[2 of 2 positions shown; findings below may reference images not displayed]

FINDINGS: There is cardiomegaly with a minimal right effusion and mild
residual edema consistent with a degree of residual congestive heart
failure. There is no airspace consolidation. There is pulmonary
venous hypertension. No adenopathy. There are surgical clips in the
right axillary region.
IMPRESSION: A degree of congestive heart failure remains. No airspace
consolidation.
